# Patient Record
Sex: Female | Born: 1965
Health system: Southern US, Community
[De-identification: ages and names within clinical notes are randomized; demographics above are authoritative.]

## PROBLEM LIST (undated history)

## (undated) DIAGNOSIS — E559 Vitamin D deficiency, unspecified: Secondary | ICD-10-CM

## (undated) DIAGNOSIS — I10 Essential (primary) hypertension: Secondary | ICD-10-CM

## (undated) DIAGNOSIS — M2550 Pain in unspecified joint: Secondary | ICD-10-CM

## (undated) DIAGNOSIS — J302 Other seasonal allergic rhinitis: Secondary | ICD-10-CM

## (undated) DIAGNOSIS — K219 Gastro-esophageal reflux disease without esophagitis: Secondary | ICD-10-CM

## (undated) DIAGNOSIS — B019 Varicella without complication: Secondary | ICD-10-CM

## (undated) DIAGNOSIS — R61 Generalized hyperhidrosis: Secondary | ICD-10-CM

## (undated) DIAGNOSIS — F419 Anxiety disorder, unspecified: Secondary | ICD-10-CM

## (undated) DIAGNOSIS — R0602 Shortness of breath: Secondary | ICD-10-CM

## (undated) DIAGNOSIS — M25569 Pain in unspecified knee: Secondary | ICD-10-CM

## (undated) HISTORY — DX: Pain in unspecified joint: M25.50

## (undated) HISTORY — DX: Essential (primary) hypertension: I10

## (undated) HISTORY — DX: Vitamin D deficiency, unspecified: E55.9

## (undated) HISTORY — DX: Anxiety disorder, unspecified: F41.9

## (undated) HISTORY — DX: Other seasonal allergic rhinitis: J30.2

## (undated) HISTORY — DX: Generalized hyperhidrosis: R61

## (undated) HISTORY — PX: ABLATION: SHX5711

## (undated) HISTORY — DX: Gastro-esophageal reflux disease without esophagitis: K21.9

## (undated) HISTORY — DX: Varicella without complication: B01.9

## (undated) HISTORY — DX: Pain in unspecified knee: M25.569

## (undated) HISTORY — DX: Shortness of breath: R06.02

---

## 1989-03-01 HISTORY — PX: TONSILLECTOMY: SUR1361

## 1997-07-23 ENCOUNTER — Other Ambulatory Visit: Admission: RE | Admit: 1997-07-23 | Discharge: 1997-07-23 | Payer: Self-pay | Admitting: Gynecology

## 1998-05-01 ENCOUNTER — Other Ambulatory Visit: Admission: RE | Admit: 1998-05-01 | Discharge: 1998-05-01 | Payer: Self-pay | Admitting: Gynecology

## 1998-05-19 ENCOUNTER — Other Ambulatory Visit: Admission: RE | Admit: 1998-05-19 | Discharge: 1998-05-19 | Payer: Self-pay | Admitting: Obstetrics and Gynecology

## 1998-12-31 ENCOUNTER — Inpatient Hospital Stay (HOSPITAL_COMMUNITY): Admission: AD | Admit: 1998-12-31 | Discharge: 1999-01-03 | Payer: Self-pay | Admitting: Obstetrics & Gynecology

## 1999-01-30 ENCOUNTER — Other Ambulatory Visit: Admission: RE | Admit: 1999-01-30 | Discharge: 1999-01-30 | Payer: Self-pay | Admitting: Obstetrics and Gynecology

## 1999-06-22 ENCOUNTER — Other Ambulatory Visit: Admission: RE | Admit: 1999-06-22 | Discharge: 1999-06-22 | Payer: Self-pay | Admitting: Gynecology

## 1999-12-23 ENCOUNTER — Other Ambulatory Visit: Admission: RE | Admit: 1999-12-23 | Discharge: 1999-12-23 | Payer: Self-pay | Admitting: Gynecology

## 2000-09-07 ENCOUNTER — Other Ambulatory Visit: Admission: RE | Admit: 2000-09-07 | Discharge: 2000-09-07 | Payer: Self-pay | Admitting: Gynecology

## 2002-01-12 ENCOUNTER — Other Ambulatory Visit: Admission: RE | Admit: 2002-01-12 | Discharge: 2002-01-12 | Payer: Self-pay | Admitting: Obstetrics and Gynecology

## 2003-11-14 ENCOUNTER — Other Ambulatory Visit: Admission: RE | Admit: 2003-11-14 | Discharge: 2003-11-14 | Payer: Self-pay | Admitting: Obstetrics and Gynecology

## 2005-01-14 ENCOUNTER — Other Ambulatory Visit: Admission: RE | Admit: 2005-01-14 | Discharge: 2005-01-14 | Payer: Self-pay | Admitting: Obstetrics and Gynecology

## 2005-03-30 ENCOUNTER — Ambulatory Visit: Payer: Self-pay | Admitting: Internal Medicine

## 2005-05-21 ENCOUNTER — Ambulatory Visit (HOSPITAL_COMMUNITY): Admission: RE | Admit: 2005-05-21 | Discharge: 2005-05-21 | Payer: Self-pay | Admitting: Obstetrics and Gynecology

## 2006-06-07 ENCOUNTER — Inpatient Hospital Stay (HOSPITAL_COMMUNITY): Admission: AD | Admit: 2006-06-07 | Discharge: 2006-06-07 | Payer: Self-pay | Admitting: Obstetrics and Gynecology

## 2006-06-08 ENCOUNTER — Inpatient Hospital Stay (HOSPITAL_COMMUNITY): Admission: RE | Admit: 2006-06-08 | Discharge: 2006-06-09 | Payer: Self-pay | Admitting: Obstetrics and Gynecology

## 2010-04-21 ENCOUNTER — Other Ambulatory Visit: Payer: Self-pay | Admitting: Obstetrics and Gynecology

## 2010-04-23 ENCOUNTER — Other Ambulatory Visit: Payer: Self-pay | Admitting: Obstetrics and Gynecology

## 2010-04-23 DIAGNOSIS — R928 Other abnormal and inconclusive findings on diagnostic imaging of breast: Secondary | ICD-10-CM

## 2010-04-29 ENCOUNTER — Ambulatory Visit
Admission: RE | Admit: 2010-04-29 | Discharge: 2010-04-29 | Disposition: A | Discharge status: Home / Self Care | Payer: PRIVATE HEALTH INSURANCE | Source: Ambulatory Visit | Attending: Obstetrics and Gynecology | Admitting: Obstetrics and Gynecology

## 2010-04-29 DIAGNOSIS — R928 Other abnormal and inconclusive findings on diagnostic imaging of breast: Secondary | ICD-10-CM

## 2013-12-07 ENCOUNTER — Other Ambulatory Visit: Payer: Self-pay | Admitting: Obstetrics and Gynecology

## 2013-12-10 LAB — CYTOLOGY - PAP

## 2015-03-18 LAB — HM PAP SMEAR

## 2015-03-20 ENCOUNTER — Other Ambulatory Visit: Payer: Self-pay | Admitting: Obstetrics and Gynecology

## 2015-03-21 LAB — CYTOLOGY - PAP

## 2015-04-24 ENCOUNTER — Other Ambulatory Visit: Payer: Self-pay | Admitting: Obstetrics and Gynecology

## 2015-12-18 ENCOUNTER — Encounter: Payer: Self-pay | Admitting: Primary Care

## 2015-12-18 ENCOUNTER — Ambulatory Visit (INDEPENDENT_AMBULATORY_CARE_PROVIDER_SITE_OTHER): Payer: BLUE CROSS/BLUE SHIELD | Admitting: Primary Care

## 2015-12-18 VITALS — BP 116/74 | HR 84 | Temp 98.4°F | Ht 65.0 in | Wt 313.8 lb

## 2015-12-18 DIAGNOSIS — G8929 Other chronic pain: Secondary | ICD-10-CM

## 2015-12-18 DIAGNOSIS — M25512 Pain in left shoulder: Secondary | ICD-10-CM | POA: Diagnosis not present

## 2015-12-18 DIAGNOSIS — F411 Generalized anxiety disorder: Secondary | ICD-10-CM | POA: Diagnosis not present

## 2015-12-18 MED ORDER — FLUOXETINE HCL 20 MG PO TABS: 20.0000 mg | ORAL_TABLET | Freq: Every day | ORAL | 1 refills | Status: DC

## 2015-12-18 NOTE — Assessment & Plan Note (Signed)
Chronic, worse with laying and propping up on her left shoulder, improved without. Discussed to rest left shoulder, NSAID's PRN, ice. Exam unremarkable, will continue to monitor.

## 2015-12-18 NOTE — Assessment & Plan Note (Signed)
GAD 7 score of 17. Long discussion today regarding treatment options, she elects to start medication.  Rx for Fluoxetine 20 mg tablets sent to pharmacy. Patient is to take 1/2 tablet daily for 8 days, then advance to 1 full tablet thereafter. We discussed possible side effects of headache, GI upset, drowsiness, and SI/HI. If thoughts of SI/HI develop, we discussed to present to the emergency immediately. Patient verbalized understanding.   Follow up in 6 weeks for re-evaluation.

## 2015-12-18 NOTE — Progress Notes (Signed)
Pre visit review using our clinic review tool, if applicable. No additional management support is needed unless otherwise documented below in the visit note. 

## 2015-12-18 NOTE — Patient Instructions (Signed)
Start Fluoxetine 20 mg tablets for anxiety. Take 1/2 tablet by mouth daily for 8 days, then advance to 1 full tablet thereafter.  Please don't hesitate to e-mail or call me with any additional questions/concerns.  Please schedule a follow up appointment with me in 6 weeks for re-evaluation.   It was a pleasure to see you today! Please don't hesitate to call me with any questions. Welcome to [facility]!

## 2015-12-18 NOTE — Progress Notes (Signed)
Subjective:    Patient ID: Lindsey BignessMary J Dunlap, female    DOB: August 10, 1965, 50 y.o.   MRN: [id number]  HPI  Lindsey Dunlap is a 50 year old female who presents today to establish care and discuss the problems mentioned below. Will obtain old records.  1) Shoulder Pain: Located to the left anterior shoulder. She sleeps and props up her left shoulder when sleeping and reading. She's tried using OTC TENS units and hot baths with improvement. Once she's feeling improved she will start to lay on her left side again and the pain will return. She denies recent inury/trauma, numbness/tingling, weakness, decrease in ROM.   2) GAD: Over the past several years she's noticed feeling anxious, palpitations, daily worry, palpitations. Over the past 6 months she's noticed an increase in these symptoms as her husband works out of the state during the week. She also experiences difficulty sleeping (falling asleep) when her husband is out of town. She will be frightened with any small sound or noise within the house, and will wake up feeling paranoid as though she is supposed to be "on watch". GAD 7 score of 17 today. She has never been treated for her anxiety in the past. She does take tylenol PM every night with improvement in sleep. She denies depression, tearfulness, SI/HI.  Review of Systems  Constitutional: Negative for fatigue.  Respiratory: Negative for shortness of breath.   Cardiovascular: Negative for chest pain and palpitations.  Musculoskeletal: Positive for arthralgias.  Neurological: Negative for dizziness and headaches.  Psychiatric/Behavioral: Positive for sleep disturbance. Negative for suicidal ideas. The patient is nervous/anxious.        Past Medical History:  Diagnosis Date  . Allergy   . Chickenpox   . GERD (gastroesophageal reflux disease)      Social History   Social History  . Marital status: Married    Spouse name: N/A  . Number of children: N/A  . Years of education: N/A    Occupational History  . Not on file.   Social History Main Topics  . Smoking status: Former Games developermoker  . Smokeless tobacco: Not on file  . Alcohol use No  . Drug use: Unknown  . Sexual activity: Not on file   Other Topics Concern  . Not on file   Social History Narrative   Married.   2 children.   Works as a Futures traderhomemaker.   Enjoys reading, working on projects    Past Surgical History:  Procedure Laterality Date  . TONSILLECTOMY  1991    Family History  Problem Relation Age of Onset  . Hyperlipidemia Mother   . Hypertension Mother   . Arthritis/Rheumatoid Sister   . Hypertension Maternal Grandmother   . Hypertension Maternal Grandfather     No Known Allergies  No current outpatient prescriptions on file prior to visit.   No current facility-administered medications on file prior to visit.     BP 116/74   Pulse 84   Temp 98.4 F (36.9 C) (Oral)   Ht 5\' 5"  (1.651 m)   Wt (!) 313 lb 12.8 oz (142.3 kg)   SpO2 96%   BMI 52.22 kg/m    Objective:   Physical Exam  Constitutional: She appears well-nourished.  Neck: Neck supple.  Cardiovascular: Normal rate and regular rhythm.   Pulmonary/Chest: Effort normal and breath sounds normal.  Musculoskeletal:       Left shoulder: She exhibits normal range of motion, no tenderness, no bony tenderness, no swelling and no  pain.  Skin: Skin is warm and dry.  Psychiatric: She has a normal mood and affect.          Assessment & Plan:

## 2015-12-29 ENCOUNTER — Encounter: Payer: Self-pay | Admitting: Primary Care

## 2016-01-18 ENCOUNTER — Other Ambulatory Visit: Payer: Self-pay | Admitting: Primary Care

## 2016-01-18 DIAGNOSIS — Z0000 Encounter for general adult medical examination without abnormal findings: Secondary | ICD-10-CM

## 2016-01-26 ENCOUNTER — Other Ambulatory Visit (INDEPENDENT_AMBULATORY_CARE_PROVIDER_SITE_OTHER): Payer: BLUE CROSS/BLUE SHIELD

## 2016-01-26 DIAGNOSIS — Z0000 Encounter for general adult medical examination without abnormal findings: Secondary | ICD-10-CM

## 2016-01-26 LAB — LIPID PANEL
Cholesterol: 161 mg/dL (ref 0–200)
HDL: 43.7 mg/dL (ref >39.00)
LDL Cholesterol: 93 mg/dL (ref 0–99)
NonHDL: 117.71
Total CHOL/HDL Ratio: 4
Triglycerides: 123 mg/dL (ref 0.0–149.0)
VLDL: 24.6 mg/dL (ref 0.0–40.0)

## 2016-01-26 LAB — COMPREHENSIVE METABOLIC PANEL
ALT: 19 U/L (ref 0–35)
AST: 18 U/L (ref 0–37)
Albumin: 4 g/dL (ref 3.5–5.2)
Alkaline Phosphatase: 76 U/L (ref 39–117)
BUN: 12 mg/dL (ref 6–23)
CO2: 29 mEq/L (ref 19–32)
Calcium: 9.3 mg/dL (ref 8.4–10.5)
Chloride: 106 mEq/L (ref 96–112)
Creatinine, Ser: 0.88 mg/dL (ref 0.40–1.20)
GFR: 72.91 mL/min (ref >60.00)
Glucose, Bld: 100 mg/dL — ABNORMAL HIGH (ref 70–99)
Potassium: 4.5 mEq/L (ref 3.5–5.1)
Sodium: 139 mEq/L (ref 135–145)
Total Bilirubin: 0.6 mg/dL (ref 0.2–1.2)
Total Protein: 6.8 g/dL (ref 6.0–8.3)

## 2016-01-26 LAB — VITAMIN D 25 HYDROXY (VIT D DEFICIENCY, FRACTURES): VITD: 26.6 ng/mL — ABNORMAL LOW (ref 30.00–100.00)

## 2016-01-26 LAB — HEMOGLOBIN A1C: Hgb A1c MFr Bld: 5.3 % (ref 4.6–6.5)

## 2016-01-30 ENCOUNTER — Ambulatory Visit (INDEPENDENT_AMBULATORY_CARE_PROVIDER_SITE_OTHER): Payer: BLUE CROSS/BLUE SHIELD | Admitting: Primary Care

## 2016-01-30 ENCOUNTER — Encounter: Payer: BLUE CROSS/BLUE SHIELD | Admitting: Primary Care

## 2016-01-30 ENCOUNTER — Encounter: Payer: Self-pay | Admitting: Primary Care

## 2016-01-30 VITALS — BP 122/78 | HR 71 | Temp 98.2°F | Ht 65.0 in | Wt 313.8 lb

## 2016-01-30 DIAGNOSIS — F411 Generalized anxiety disorder: Secondary | ICD-10-CM

## 2016-01-30 DIAGNOSIS — Z23 Encounter for immunization: Secondary | ICD-10-CM

## 2016-01-30 DIAGNOSIS — Z0000 Encounter for general adult medical examination without abnormal findings: Secondary | ICD-10-CM | POA: Diagnosis not present

## 2016-01-30 DIAGNOSIS — G8929 Other chronic pain: Secondary | ICD-10-CM

## 2016-01-30 DIAGNOSIS — M25512 Pain in left shoulder: Secondary | ICD-10-CM

## 2016-01-30 MED ORDER — FLUOXETINE HCL 20 MG PO TABS: 20.0000 mg | ORAL_TABLET | Freq: Every day | ORAL | 3 refills | Status: DC

## 2016-01-30 NOTE — Patient Instructions (Signed)
You were provided with an influenza vaccination.  It's important to work on improvements in your diet by reducing consumption of fast food, fried food, processed snack foods, sugary drinks. Increase consumption of fresh vegetables and fruits, whole grains, water.  Ensure you are drinking 64 ounces of water daily.  Start exercising. You should be getting 150 minutes of moderate intensity exercise weekly.  Start taking a multi-vitamin or vitamin D supplement.   Follow up in 1 year for your annual physical or sooner if needed.  It was a pleasure to see you today!

## 2016-01-30 NOTE — Progress Notes (Signed)
Subjective:    Patient ID: Lindsey Dunlap, female    DOB: April 21, 1965, 50 y.o.   MRN: [id number]  HPI  Lindsey Dunlap is a 50 year old female who presents today for complete physical and for follow up of GAD.  Immunizations: -Tetanus: Completed 5 years ago.  -Influenza: Due today.   Diet: She endorses a fair diet. Breakfast: Fast food, fruit, peanut butter sandwich, eggs Lunch: Left overs from dinner, sandwiches Dinner: Meat, vegetables, starch, tacos Snacks: Occasionally, crackers Desserts: Daily Beverages: Water (2-3 bottles daily), sweet tea, soda  Exercise: She does not currently exercise. Eye exam: Completed in October 2017. Dental exam: Completed semi-annually. Pap Smear: Completed in 2017 Mammogram: Completed in January 2017.  1) GAD: Presented as a new patient on 12/18/15 with complaints of constantly feeling anxious, intermittent palpitations, daily worry. GAD 7 score that day of 17 so she was initiated on Fluoxetine and discussed to follow up today. Since her last visit she's feeling much better. She's able to sleep much better, she's noticed a decrease in overall anxiety, and feeling less paranoid when her husband is out of town. She denies SI/HI. GI upset, nausea, headaches.  Review of Systems  Constitutional: Negative for unexpected weight change.  HENT: Negative for rhinorrhea.   Respiratory: Negative for cough and shortness of breath.   Cardiovascular: Negative for chest pain.  Gastrointestinal: Negative for constipation and diarrhea.  Genitourinary: Negative for difficulty urinating and menstrual problem.  Musculoskeletal: Negative for arthralgias and myalgias.  Skin: Negative for rash.  Allergic/Immunologic: Negative for environmental allergies.  Neurological: Negative for dizziness, numbness and headaches.  Psychiatric/Behavioral:       Feels much better on Fluoxetine, some sleep disturbance but overall better.       Past Medical History:  Diagnosis  Date  . Allergy   . Chickenpox   . GERD (gastroesophageal reflux disease)      Social History   Social History  . Marital status: Married    Spouse name: N/A  . Number of children: N/A  . Years of education: N/A   Occupational History  . Not on file.   Social History Main Topics  . Smoking status: Former Games developermoker  . Smokeless tobacco: Not on file  . Alcohol use No  . Drug use: Unknown  . Sexual activity: Not on file   Other Topics Concern  . Not on file   Social History Narrative   Married.   2 children.   Works as a Futures traderhomemaker.   Enjoys reading, working on projects    Past Surgical History:  Procedure Laterality Date  . TONSILLECTOMY  1991    Family History  Problem Relation Age of Onset  . Hyperlipidemia Mother   . Hypertension Mother   . Arthritis/Rheumatoid Sister   . Hypertension Maternal Grandmother   . Hypertension Maternal Grandfather     No Known Allergies  No current outpatient prescriptions on file prior to visit.   No current facility-administered medications on file prior to visit.     BP 122/78   Pulse 71   Temp 98.2 F (36.8 C) (Oral)   Ht 5\' 5"  (1.651 m)   Wt (!) 313 lb 12.8 oz (142.3 kg)   SpO2 97%   BMI 52.22 kg/m    Objective:   Physical Exam  Constitutional: She is oriented to person, place, and time. She appears well-nourished.  HENT:  Right Ear: Tympanic membrane and ear canal normal.  Left Ear: Tympanic membrane and ear canal  normal.  Nose: Nose normal.  Mouth/Throat: Oropharynx is clear and moist.  Eyes: Conjunctivae and EOM are normal. Pupils are equal, round, and reactive to light.  Neck: Neck supple. No thyromegaly present.  Cardiovascular: Normal rate and regular rhythm.   No murmur heard. Pulmonary/Chest: Effort normal and breath sounds normal. She has no rales.  Abdominal: Soft. Bowel sounds are normal. There is no tenderness.  Musculoskeletal: Normal range of motion.  Lymphadenopathy:    She has no cervical  adenopathy.  Neurological: She is alert and oriented to person, place, and time. She has normal reflexes. No cranial nerve deficit.  Skin: Skin is warm and dry. No rash noted.  Psychiatric: She has a normal mood and affect.          Assessment & Plan:

## 2016-01-30 NOTE — Progress Notes (Signed)
Pre visit review using our clinic review tool, if applicable. No additional management support is needed unless otherwise documented below in the visit note. 

## 2016-01-31 DIAGNOSIS — Z0001 Encounter for general adult medical examination with abnormal findings: Secondary | ICD-10-CM

## 2016-01-31 DIAGNOSIS — Z1211 Encounter for screening for malignant neoplasm of colon: Secondary | ICD-10-CM | POA: Insufficient documentation

## 2016-01-31 NOTE — Assessment & Plan Note (Signed)
Improved since anxiety has reduced. Will continue to monitor.

## 2016-01-31 NOTE — Assessment & Plan Note (Signed)
Immunizations UTD, influenza vaccination provided today. Mammogram and Pap UTD. Poor diet, discussed to increase vegetables, fruit, whole grains. Eliminate fast food, sodas, sweet tea. Start exercising. Exam unremarkable. Labs unremarkable. Follow up in 1 year for annual exam.

## 2016-01-31 NOTE — Assessment & Plan Note (Signed)
Much improved on fluoxetine 20 mg. Denies SI/HI. Continue current regimen.

## 2016-04-14 ENCOUNTER — Encounter: Payer: Self-pay | Admitting: Primary Care

## 2016-06-25 DIAGNOSIS — Z6841 Body Mass Index (BMI) 40.0 and over, adult: Secondary | ICD-10-CM | POA: Diagnosis not present

## 2016-06-25 DIAGNOSIS — Z1231 Encounter for screening mammogram for malignant neoplasm of breast: Secondary | ICD-10-CM | POA: Diagnosis not present

## 2016-06-25 DIAGNOSIS — Z01419 Encounter for gynecological examination (general) (routine) without abnormal findings: Secondary | ICD-10-CM | POA: Diagnosis not present

## 2016-07-09 DIAGNOSIS — H25013 Cortical age-related cataract, bilateral: Secondary | ICD-10-CM | POA: Diagnosis not present

## 2016-07-09 DIAGNOSIS — H25043 Posterior subcapsular polar age-related cataract, bilateral: Secondary | ICD-10-CM | POA: Diagnosis not present

## 2016-07-09 DIAGNOSIS — H02839 Dermatochalasis of unspecified eye, unspecified eyelid: Secondary | ICD-10-CM | POA: Diagnosis not present

## 2016-07-09 DIAGNOSIS — H2513 Age-related nuclear cataract, bilateral: Secondary | ICD-10-CM | POA: Diagnosis not present

## 2017-01-12 DIAGNOSIS — H04123 Dry eye syndrome of bilateral lacrimal glands: Secondary | ICD-10-CM | POA: Diagnosis not present

## 2017-01-12 DIAGNOSIS — H40033 Anatomical narrow angle, bilateral: Secondary | ICD-10-CM | POA: Diagnosis not present

## 2017-02-17 ENCOUNTER — Other Ambulatory Visit: Payer: Self-pay | Admitting: Primary Care

## 2017-02-17 DIAGNOSIS — F411 Generalized anxiety disorder: Secondary | ICD-10-CM

## 2017-04-20 DIAGNOSIS — H04123 Dry eye syndrome of bilateral lacrimal glands: Secondary | ICD-10-CM | POA: Diagnosis not present

## 2017-05-18 ENCOUNTER — Other Ambulatory Visit: Payer: Self-pay | Admitting: Primary Care

## 2017-05-18 DIAGNOSIS — Z0000 Encounter for general adult medical examination without abnormal findings: Secondary | ICD-10-CM

## 2017-05-20 ENCOUNTER — Other Ambulatory Visit (INDEPENDENT_AMBULATORY_CARE_PROVIDER_SITE_OTHER): Payer: BLUE CROSS/BLUE SHIELD

## 2017-05-20 DIAGNOSIS — Z0000 Encounter for general adult medical examination without abnormal findings: Secondary | ICD-10-CM

## 2017-05-20 LAB — LIPID PANEL
Cholesterol: 172 mg/dL (ref 0–200)
HDL: 52.5 mg/dL (ref >39.00)
LDL Cholesterol: 95 mg/dL (ref 0–99)
NonHDL: 119.99
Total CHOL/HDL Ratio: 3
Triglycerides: 127 mg/dL (ref 0.0–149.0)
VLDL: 25.4 mg/dL (ref 0.0–40.0)

## 2017-05-20 LAB — COMPREHENSIVE METABOLIC PANEL
ALT: 31 U/L (ref 0–35)
AST: 27 U/L (ref 0–37)
Albumin: 4.2 g/dL (ref 3.5–5.2)
Alkaline Phosphatase: 98 U/L (ref 39–117)
BUN: 12 mg/dL (ref 6–23)
CO2: 29 mEq/L (ref 19–32)
Calcium: 9.3 mg/dL (ref 8.4–10.5)
Chloride: 105 mEq/L (ref 96–112)
Creatinine, Ser: 0.82 mg/dL (ref 0.40–1.20)
GFR: 78.67 mL/min (ref >60.00)
Glucose, Bld: 96 mg/dL (ref 70–99)
Potassium: 4.1 mEq/L (ref 3.5–5.1)
Sodium: 141 mEq/L (ref 135–145)
Total Bilirubin: 0.5 mg/dL (ref 0.2–1.2)
Total Protein: 7.4 g/dL (ref 6.0–8.3)

## 2017-05-23 ENCOUNTER — Ambulatory Visit (INDEPENDENT_AMBULATORY_CARE_PROVIDER_SITE_OTHER): Payer: BLUE CROSS/BLUE SHIELD | Admitting: Primary Care

## 2017-05-23 ENCOUNTER — Encounter: Payer: Self-pay | Admitting: Primary Care

## 2017-05-23 VITALS — BP 116/78 | HR 79 | Temp 98.1°F | Ht 65.0 in | Wt 327.2 lb

## 2017-05-23 DIAGNOSIS — J302 Other seasonal allergic rhinitis: Secondary | ICD-10-CM

## 2017-05-23 DIAGNOSIS — Z0001 Encounter for general adult medical examination with abnormal findings: Secondary | ICD-10-CM | POA: Diagnosis not present

## 2017-05-23 DIAGNOSIS — R220 Localized swelling, mass and lump, head: Secondary | ICD-10-CM | POA: Diagnosis not present

## 2017-05-23 DIAGNOSIS — F411 Generalized anxiety disorder: Secondary | ICD-10-CM | POA: Diagnosis not present

## 2017-05-23 MED ORDER — FLUOXETINE HCL 20 MG PO TABS: 20.0000 mg | ORAL_TABLET | Freq: Every day | ORAL | 3 refills | Status: DC

## 2017-05-23 MED ORDER — ALBUTEROL SULFATE HFA 108 (90 BASE) MCG/ACT IN AERS: 2.0000 | INHALATION_SPRAY | RESPIRATORY_TRACT | 0 refills | Status: DC | PRN

## 2017-05-23 NOTE — Assessment & Plan Note (Signed)
Occurs with season changes, uses albuterol infrequently, needs new inhaler. Rx sent to pharmacy.

## 2017-05-23 NOTE — Patient Instructions (Signed)
You will be contacted regarding your ultrasound.  Please let us know if you have not been contacted within one week.   Follow up with gynecology for your annual exam and mammogram.   Continue exercising. You should be getting 150 minutes of moderate intensity exercise weekly.  Increase vegetables, fruit, whole grains, lean protein.  Ensure you are consuming 64 ounces of water daily.  Follow up in 1 year for your annual exam or sooner if needed.  It was a pleasure to see you today!   Preventive Care 40-64 Years, Female Preventive care refers to lifestyle choices and visits with your health care provider that can promote health and wellness. What does preventive care include?  A yearly physical exam. This is also called an annual well check.  Dental exams once or twice a year.  Routine eye exams. Ask your health care provider how often you should have your eyes checked.  Personal lifestyle choices, including: ? Daily care of your teeth and gums. ? Regular physical activity. ? Eating a healthy diet. ? Avoiding tobacco and drug use. ? Limiting alcohol use. ? Practicing safe sex. ? Taking low-dose aspirin daily starting at age 96. ? Taking vitamin and mineral supplements as recommended by your health care provider. What happens during an annual well check? The services and screenings done by your health care provider during your annual well check will depend on your age, overall health, lifestyle risk factors, and family history of disease. Counseling Your health care provider may ask you questions about your:  Alcohol use.  Tobacco use.  Drug use.  Emotional well-being.  Home and relationship well-being.  Sexual activity.  Eating habits.  Work and work Statistician.  Method of birth control.  Menstrual cycle.  Pregnancy history.  Screening You may have the following tests or measurements:  Height, weight, and BMI.  Blood pressure.  Lipid and cholesterol  levels. These may be checked every 5 years, or more frequently if you are over 63 years old.  Skin check.  Lung cancer screening. You may have this screening every year starting at age 6 if you have a 30-pack-year history of smoking and currently smoke or have quit within the past 15 years.  Fecal occult blood test (FOBT) of the stool. You may have this test every year starting at age 40.  Flexible sigmoidoscopy or colonoscopy. You may have a sigmoidoscopy every 5 years or a colonoscopy every 10 years starting at age 55.  Hepatitis C blood test.  Hepatitis B blood test.  Sexually transmitted disease (STD) testing.  Diabetes screening. This is done by checking your blood sugar (glucose) after you have not eaten for a while (fasting). You may have this done every 1-3 years.  Mammogram. This may be done every 1-2 years. Talk to your health care provider about when you should start having regular mammograms. This may depend on whether you have a family history of breast cancer.  BRCA-related cancer screening. This may be done if you have a family history of breast, ovarian, tubal, or peritoneal cancers.  Pelvic exam and Pap test. This may be done every 3 years starting at age 63. Starting at age 60, this may be done every 5 years if you have a Pap test in combination with an HPV test.  Bone density scan. This is done to screen for osteoporosis. You may have this scan if you are at high risk for osteoporosis.  Discuss your test results, treatment options, and if necessary,  the need for more tests with your health care provider. Vaccines Your health care provider may recommend certain vaccines, such as:  Influenza vaccine. This is recommended every year.  Tetanus, diphtheria, and acellular pertussis (Tdap, Td) vaccine. You may need a Td booster every 10 years.  Varicella vaccine. You may need this if you have not been vaccinated.  Zoster vaccine. You may need this after age  22.  Measles, mumps, and rubella (MMR) vaccine. You may need at least one dose of MMR if you were born in 1957 or later. You may also need a second dose.  Pneumococcal 13-valent conjugate (PCV13) vaccine. You may need this if you have certain conditions and were not previously vaccinated.  Pneumococcal polysaccharide (PPSV23) vaccine. You may need one or two doses if you smoke cigarettes or if you have certain conditions.  Meningococcal vaccine. You may need this if you have certain conditions.  Hepatitis A vaccine. You may need this if you have certain conditions or if you travel or work in places where you may be exposed to hepatitis A.  Hepatitis B vaccine. You may need this if you have certain conditions or if you travel or work in places where you may be exposed to hepatitis B.  Haemophilus influenzae type b (Hib) vaccine. You may need this if you have certain conditions.  Talk to your health care provider about which screenings and vaccines you need and how often you need them. This information is not intended to replace advice given to you by your health care provider. Make sure you discuss any questions you have with your health care provider. Document Released: 03/14/2015 Document Revised: 11/05/2015 Document Reviewed: 12/17/2014 Elsevier Interactive Patient Education  [employer].

## 2017-05-23 NOTE — Progress Notes (Signed)
Subjective:    Patient ID: Lindsey Dunlap, female    DOB: 09-21-1965, 52 y.o.   MRN: [id number]  HPI  Lindsey Dunlap is a 52 year old female who presents today for complete physical.  Has noticed "lumps" to her bilateral submandibular region since November 2018. She's had two URI's with resolve within the last 6 weeks. The masses did not increase or decrease during or in between URI's. She denies tenderness.   Immunizations: -Tetanus: Completed in 2013 -Influenza: Did not completed this last season    Diet: She endorses a poor diet.  Breakfast: Biscuit Lunch: Left overs, sandwiches Dinner: Pasta, grilled meat, vegetables, take out/pizza once weekly Snacks: Popcorn, cheese crackers Desserts: Daily  Beverages: Water, soda, sweet tea (less sugar)   Exercise: She is going to the gym 3-4 days weekly for one hour at a time.  Eye exam: Completed in October 2018 Dental exam: Completes semi-annually  Pap Smear: Completed in 2017, follows with GYN Mammogram: Completed in April/May 2018, follows with GYN.   Review of Systems  Constitutional: Negative for unexpected weight change.  HENT: Negative for rhinorrhea.   Respiratory: Negative for cough and shortness of breath.   Cardiovascular: Negative for chest pain.  Gastrointestinal: Negative for constipation and diarrhea.  Genitourinary: Negative for difficulty urinating and menstrual problem.  Musculoskeletal: Negative for arthralgias and myalgias.  Skin: Negative for rash.  Allergic/Immunologic: Negative for environmental allergies.  Neurological: Negative for dizziness, numbness and headaches.  Hematological: Positive for adenopathy.       Past Medical History:  Diagnosis Date  . Chickenpox   . GERD (gastroesophageal reflux disease)   . Seasonal allergies      Social History   Socioeconomic History  . Marital status: Married    Spouse name: Not on file  . Number of children: Not on file  . Years of education: Not on  file  . Highest education level: Not on file  Occupational History  . Not on file  Social Needs  . Financial resource strain: Not on file  . Food insecurity:    Worry: Not on file    Inability: Not on file  . Transportation needs:    Medical: Not on file    Non-medical: Not on file  Tobacco Use  . Smoking status: Former Games developermoker  . Smokeless tobacco: Never Used  Substance and Sexual Activity  . Alcohol use: No  . Drug use: Not on file  . Sexual activity: Not on file  Lifestyle  . Physical activity:    Days per week: Not on file    Minutes per session: Not on file  . Stress: Not on file  Relationships  . Social connections:    Talks on phone: Not on file    Gets together: Not on file    Attends religious service: Not on file    Active member of club or organization: Not on file    Attends meetings of clubs or organizations: Not on file    Relationship status: Not on file  . Intimate partner violence:    Fear of current or ex partner: Not on file    Emotionally abused: Not on file    Physically abused: Not on file    Forced sexual activity: Not on file  Other Topics Concern  . Not on file  Social History Narrative   Married.   2 children.   Works as a Futures traderhomemaker.   Enjoys reading, working on projects  Family History  Problem Relation Age of Onset  . Hyperlipidemia Mother   . Hypertension Mother   . Arthritis/Rheumatoid Sister   . Hypertension Maternal Grandmother   . Hypertension Maternal Grandfather     No Known Allergies  No current outpatient medications on file prior to visit.   No current facility-administered medications on file prior to visit.     BP 116/78   Pulse 79   Temp 98.1 F (36.7 C) (Oral)   Ht 5\' 5"  (1.651 m)   Wt (!) 327 lb 4 oz (148.4 kg)   SpO2 97%   BMI 54.46 kg/m    Objective:   Physical Exam  Constitutional: She is oriented to person, place, and time. She appears well-nourished.  HENT:  Right Ear: Tympanic membrane and  ear canal normal.  Left Ear: Tympanic membrane and ear canal normal.  Nose: Nose normal.  Mouth/Throat: Oropharynx is clear and moist.  Eyes: Pupils are equal, round, and reactive to light. Conjunctivae and EOM are normal.  Neck: Neck supple. No thyromegaly present.    Small <0.25 cm rounded, firm, immobile, non tender masses to each lateral submandibular region.   Cardiovascular: Normal rate and regular rhythm.  No murmur heard. Pulmonary/Chest: Effort normal and breath sounds normal. She has no rales.  Abdominal: Soft. Bowel sounds are normal. There is no tenderness.  Musculoskeletal: Normal range of motion.  Lymphadenopathy:    She has cervical adenopathy.  Neurological: She is alert and oriented to person, place, and time. She has normal reflexes. No cranial nerve deficit.  Skin: Skin is warm and dry. No rash noted.  Psychiatric: She has a normal mood and affect.          Assessment & Plan:  Submandibular masses:  Located to each side of lateral submandibular region x 4 months. Exam today appears to be lymphadenopathy. Patient very worried and given duration of symptoms it is reasonable to obtain soft tissue ultrasound. Orders placed, await results.   Doreene Nest, NP

## 2017-05-23 NOTE — Assessment & Plan Note (Signed)
Doing well on Prozac, feels well managed. Refill sent to pharmacy. Denies SI/HI.

## 2017-05-23 NOTE — Assessment & Plan Note (Signed)
Immunizations UTD. Pap smear and mammogram UTD. Discussed the importance of a healthy diet and regular exercise in order for weight loss, and to reduce the risk of any potential medical problems. Exam with possible lymphadenopathy as noted, will obtain ultrasound.  Labs unremarkable. Follow up in 1 year.

## 2017-05-26 ENCOUNTER — Ambulatory Visit
Admission: RE | Admit: 2017-05-26 | Discharge: 2017-05-26 | Disposition: A | Discharge status: Home / Self Care | Payer: BLUE CROSS/BLUE SHIELD | Source: Ambulatory Visit | Attending: Primary Care | Admitting: Primary Care

## 2017-05-26 DIAGNOSIS — R220 Localized swelling, mass and lump, head: Secondary | ICD-10-CM

## 2017-05-26 DIAGNOSIS — R221 Localized swelling, mass and lump, neck: Secondary | ICD-10-CM | POA: Diagnosis not present

## 2017-07-07 ENCOUNTER — Encounter (INDEPENDENT_AMBULATORY_CARE_PROVIDER_SITE_OTHER): Payer: Self-pay | Admitting: Orthopedic Surgery

## 2017-07-07 ENCOUNTER — Ambulatory Visit (INDEPENDENT_AMBULATORY_CARE_PROVIDER_SITE_OTHER): Payer: BLUE CROSS/BLUE SHIELD

## 2017-07-07 ENCOUNTER — Ambulatory Visit (INDEPENDENT_AMBULATORY_CARE_PROVIDER_SITE_OTHER): Payer: BLUE CROSS/BLUE SHIELD | Admitting: Orthopedic Surgery

## 2017-07-07 DIAGNOSIS — M25561 Pain in right knee: Secondary | ICD-10-CM

## 2017-07-07 DIAGNOSIS — G8929 Other chronic pain: Secondary | ICD-10-CM

## 2017-07-07 DIAGNOSIS — M25562 Pain in left knee: Secondary | ICD-10-CM

## 2017-07-10 ENCOUNTER — Encounter (INDEPENDENT_AMBULATORY_CARE_PROVIDER_SITE_OTHER): Payer: Self-pay | Admitting: Orthopedic Surgery

## 2017-07-10 NOTE — Progress Notes (Signed)
Office Visit Note   Patient: Lindsey Dunlap           Date of Birth: 02-May-1965           MRN: [id number] Visit Date: 07/07/2017 Requested by: Doreene Nest, NP [address] [place], [place] [address] PCP: Doreene Nest, NP  Subjective: Chief Complaint  Patient presents with  . Right Knee - Pain  . Left Knee - Pain    HPI: Elda is a patient with bilateral knee pain right worse than left.  She fell about a year and a half ago and is been worse over the last 2 months after going to [place] on a field trip with her daughter.  She did a lot of walking at that time.  She is unsure if she has swelling but she does report weakness popping and occasionally waking up with pain.  Takes Tylenol and ibuprofen as needed.  Steps to hurt the patient primarily in the anterior aspect of the knee.  She does use a compression sleeve.              ROS: All systems reviewed are negative as they relate to the chief complaint within the history of present illness.  Patient denies  fevers or chills.   Assessment & Plan: Visit Diagnoses:  1. Chronic pain of both knees     Plan: Impression is bilateral knee pain primarily anterior.  She has a little bit more patellofemoral crepitus right versus left.  She also has some lateral patellofemoral joint where noted on the merchant view.  I do not see anything definitively structurally treatable at this time.  The patellofemoral arthritis might respond well to an injection when needed.  For now I am going to have her continue with non-squat and lunge type exercises just to get that quad muscle little stronger.  Injection would be the next step when symptoms warrant.  Follow-up with me as needed.  Follow-Up Instructions: Return if symptoms worsen or fail to improve.   Orders:  Orders Placed This Encounter  Procedures  . XR KNEE 3 VIEW RIGHT  . XR KNEE 3 VIEW LEFT   No orders of the defined types were placed in this encounter.     Procedures: No procedures performed   Clinical Data: No additional findings.  Objective: Vital Signs: There were no vitals taken for this visit.  Physical Exam:   Constitutional: Patient appears well-developed HEENT:  Head: Normocephalic Eyes:EOM are normal Neck: Normal range of motion Cardiovascular: Normal rate Pulmonary/chest: Effort normal Neurologic: Patient is alert Skin: Skin is warm Psychiatric: Patient has normal mood and affect    Ortho Exam: Ortho exam demonstrates full active and passive range of motion of both knees.  Patellofemoral crepitus is more on the right than the left.  Collateral cruciate ligaments are stable.  Extensor mechanism is intact.  Pedal pulses palpable.  Slight valgus alignment noted on the left knee but not the right.  No groin pain with internal/external rotation of either leg.  Specialty Comments:  No specialty comments available.  Imaging: No results found.   PMFS History: Patient Active Problem List   Diagnosis Date Noted  . Seasonal allergies 05/23/2017  . Encounter for well adult exam with abnormal findings 01/31/2016  . GAD (generalized anxiety disorder) 12/18/2015  . Shoulder pain, left 12/18/2015   Past Medical History:  Diagnosis Date  . Chickenpox   . GERD (gastroesophageal reflux disease)   . Seasonal allergies  Family History  Problem Relation Age of Onset  . Hyperlipidemia Mother   . Hypertension Mother   . Arthritis/Rheumatoid Sister   . Hypertension Maternal Grandmother   . Hypertension Maternal Grandfather     Past Surgical History:  Procedure Laterality Date  . TONSILLECTOMY  1991   Social History   Occupational History  . Not on file  Tobacco Use  . Smoking status: Former Games developer  . Smokeless tobacco: Never Used  Substance and Sexual Activity  . Alcohol use: No  . Drug use: Not on file  . Sexual activity: Not on file

## 2017-11-25 DIAGNOSIS — Z1231 Encounter for screening mammogram for malignant neoplasm of breast: Secondary | ICD-10-CM | POA: Diagnosis not present

## 2017-11-25 DIAGNOSIS — Z01419 Encounter for gynecological examination (general) (routine) without abnormal findings: Secondary | ICD-10-CM | POA: Diagnosis not present

## 2017-11-25 DIAGNOSIS — Z6841 Body Mass Index (BMI) 40.0 and over, adult: Secondary | ICD-10-CM | POA: Diagnosis not present

## 2017-11-28 LAB — HM MAMMOGRAPHY

## 2017-12-20 ENCOUNTER — Encounter: Payer: Self-pay | Admitting: Primary Care

## 2017-12-20 ENCOUNTER — Ambulatory Visit: Payer: BLUE CROSS/BLUE SHIELD | Admitting: Primary Care

## 2017-12-20 VITALS — BP 132/90 | HR 81 | Temp 98.5°F | Ht 65.0 in | Wt 327.0 lb

## 2017-12-20 DIAGNOSIS — F411 Generalized anxiety disorder: Secondary | ICD-10-CM | POA: Diagnosis not present

## 2017-12-20 DIAGNOSIS — Z23 Encounter for immunization: Secondary | ICD-10-CM

## 2017-12-20 MED ORDER — FLUOXETINE HCL 40 MG PO CAPS: 40.0000 mg | ORAL_CAPSULE | Freq: Every day | ORAL | 1 refills | Status: DC

## 2017-12-20 NOTE — Patient Instructions (Signed)
We've increased the dose of your fluoxetine to 40 mg. You may take two of the 20 mg capsules to make 40 mg until your bottle is empty. Only take one of the 40 mg capsules.   Please update me via My Chart in 4 weeks.  It was a pleasure to see you today!

## 2017-12-20 NOTE — Progress Notes (Signed)
Subjective:    Patient ID: Lindsey Dunlap, female    DOB: Feb 06, 1966, 52 y.o.   MRN: [id number]  HPI  Lindsey Dunlap is a 52 year old female with a history of anxiety disorder who presents today to discuss anxiety.   She is currently managed on fluoxetine 20 mg daily. Over the last 1-2 months she's noticed increased symptoms of anxiety including chest tightness, muscle tightness, palpitations, feeling nervous, daily worry, obsessing about things, some sleep disturbance.   She's been under a lot of stress since her oldest child has started college and she's now home schooling her other child. Her husband travels out of town often and she is at home with her children. She is also undergoing peri-menopause. GAD 7 score of 17 today. She denies symptoms of depression.   Review of Systems  Respiratory: Positive for chest tightness.   Cardiovascular: Positive for palpitations. Negative for chest pain.  Neurological: Negative for headaches.  Psychiatric/Behavioral: Positive for sleep disturbance. The patient is nervous/anxious.        See HPI       Past Medical History:  Diagnosis Date  . Chickenpox   . GERD (gastroesophageal reflux disease)   . Seasonal allergies      Social History   Socioeconomic History  . Marital status: Married    Spouse name: Not on file  . Number of children: Not on file  . Years of education: Not on file  . Highest education level: Not on file  Occupational History  . Not on file  Social Needs  . Financial resource strain: Not on file  . Food insecurity:    Worry: Not on file    Inability: Not on file  . Transportation needs:    Medical: Not on file    Non-medical: Not on file  Tobacco Use  . Smoking status: Former Games developer  . Smokeless tobacco: Never Used  Substance and Sexual Activity  . Alcohol use: No  . Drug use: Not on file  . Sexual activity: Not on file  Lifestyle  . Physical activity:    Days per week: Not on file    Minutes per  session: Not on file  . Stress: Not on file  Relationships  . Social connections:    Talks on phone: Not on file    Gets together: Not on file    Attends religious service: Not on file    Active member of club or organization: Not on file    Attends meetings of clubs or organizations: Not on file    Relationship status: Not on file  . Intimate partner violence:    Fear of current or ex partner: Not on file    Emotionally abused: Not on file    Physically abused: Not on file    Forced sexual activity: Not on file  Other Topics Concern  . Not on file  Social History Narrative   Married.   2 children.   Works as a Futures trader.   Enjoys reading, working on projects    Past Surgical History:  Procedure Laterality Date  . TONSILLECTOMY  1991    Family History  Problem Relation Age of Onset  . Hyperlipidemia Mother   . Hypertension Mother   . Arthritis/Rheumatoid Sister   . Hypertension Maternal Grandmother   . Hypertension Maternal Grandfather     No Known Allergies  Current Outpatient Medications on File Prior to Visit  Medication Sig Dispense Refill  . albuterol (PROAIR HFA)  108 (90 Base) MCG/ACT inhaler Inhale 2 puffs into the lungs every 4 (four) hours as needed for wheezing or shortness of breath. 1 Inhaler 0  . Multiple Vitamins-Minerals (PRESERVISION AREDS 2+MULTI VIT PO)     . ranitidine (ZANTAC 150 MAXIMUM STRENGTH) 150 MG tablet      No current facility-administered medications on file prior to visit.     BP 132/90 (BP Location: Right Arm, Patient Position: Sitting, Cuff Size: Large)   Pulse 81   Temp 98.5 F (36.9 C) (Oral)   Ht 5\' 5"  (1.651 m)   Wt (!) 327 lb (148.3 kg)   SpO2 98%   BMI 54.42 kg/m    Objective:   Physical Exam  Constitutional: She appears well-nourished.  Neck: Neck supple.  Cardiovascular: Normal rate and regular rhythm.  Respiratory: Effort normal and breath sounds normal.  Skin: Skin is warm and dry.  Psychiatric: She has a  normal mood and affect.           Assessment & Plan:

## 2017-12-20 NOTE — Assessment & Plan Note (Signed)
GAD 7 score of 17 today. Symptoms could be due to increased stress vs peri-menopause. Increase fluoxetine to 40 mg daily. She will update in 4 weeks. Denies SI/HI.

## 2018-03-09 ENCOUNTER — Telehealth: Payer: BLUE CROSS/BLUE SHIELD | Admitting: Family

## 2018-03-09 DIAGNOSIS — J329 Chronic sinusitis, unspecified: Secondary | ICD-10-CM | POA: Diagnosis not present

## 2018-03-09 DIAGNOSIS — B9689 Other specified bacterial agents as the cause of diseases classified elsewhere: Secondary | ICD-10-CM | POA: Diagnosis not present

## 2018-03-09 MED ORDER — AMOXICILLIN-POT CLAVULANATE 875-125 MG PO TABS: 1.0000 | ORAL_TABLET | Freq: Two times a day (BID) | ORAL | 0 refills | Status: AC

## 2018-03-09 NOTE — Progress Notes (Signed)
Thank you for the details you included in the comment boxes. Those details are very helpful in determining the best course of treatment for you and help us to provide the best care. Keep using the inhaler. For the yeast infection, it is a random thing that varies. Let us know if this happens.  We are sorry that you are not feeling well.  Here is how we plan to help!  Based on what you have shared with me it looks like you have sinusitis.  Sinusitis is inflammation and infection in the sinus cavities of the head.  Based on your presentation I believe you most likely have Acute Bacterial Sinusitis.  This is an infection caused by bacteria and is treated with antibiotics. I have prescribed Augmentin 875mg /125mg  one tablet twice daily with food, for 7 days. You may use an oral decongestant such as Mucinex D or if you have glaucoma or high blood pressure use plain Mucinex. Saline nasal spray help and can safely be used as often as needed for congestion.  If you develop worsening sinus pain, fever or notice severe headache and vision changes, or if symptoms are not better after completion of antibiotic, please schedule an appointment with a health care provider.    Sinus infections are not as easily transmitted as other respiratory infection, however we still recommend that you avoid close contact with loved ones, especially the very young and elderly.  Remember to wash your hands thoroughly throughout the day as this is the number one way to prevent the spread of infection!  Home Care:  Only take medications as instructed by your medical team.  Complete the entire course of an antibiotic.  Do not take these medications with alcohol.  A steam or ultrasonic humidifier can help congestion.  You can place a towel over your head and breathe in the steam from hot water coming from a faucet.  Avoid close contacts especially the very young and the elderly.  Cover your mouth when you cough or sneeze.  Always  remember to wash your hands.  Get Help Right Away If:  You develop worsening fever or sinus pain.  You develop a severe head ache or visual changes.  Your symptoms persist after you have completed your treatment plan.  Make sure you  Understand these instructions.  Will watch your condition.  Will get help right away if you are not doing well or get worse.  Your e-visit answers were reviewed by a board certified advanced clinical practitioner to complete your personal care plan.  Depending on the condition, your plan could have included both over the counter or prescription medications.  If there is a problem please reply  once you have received a response from your provider.  Your safety is important to us.  If you have drug allergies check your prescription carefully.    You can use MyChart to ask questions about today's visit, request a non-urgent call back, or ask for a work or school excuse for 24 hours related to this e-Visit. If it has been greater than 24 hours you will need to follow up with your provider, or enter a new e-Visit to address those concerns.  You will get an e-mail in the next two days asking about your experience.  I hope that your e-visit has been valuable and will speed your recovery. Thank you for using e-visits.

## 2018-05-01 ENCOUNTER — Encounter: Payer: Self-pay | Admitting: Primary Care

## 2018-05-01 ENCOUNTER — Ambulatory Visit: Payer: BLUE CROSS/BLUE SHIELD | Admitting: Primary Care

## 2018-05-01 VITALS — BP 152/98 | HR 77 | Temp 98.0°F | Ht 65.0 in | Wt 337.0 lb

## 2018-05-01 DIAGNOSIS — I10 Essential (primary) hypertension: Secondary | ICD-10-CM | POA: Insufficient documentation

## 2018-05-01 DIAGNOSIS — M25541 Pain in joints of right hand: Secondary | ICD-10-CM

## 2018-05-01 DIAGNOSIS — M25542 Pain in joints of left hand: Secondary | ICD-10-CM | POA: Diagnosis not present

## 2018-05-01 DIAGNOSIS — Z8261 Family history of arthritis: Secondary | ICD-10-CM | POA: Diagnosis not present

## 2018-05-01 LAB — COMPREHENSIVE METABOLIC PANEL
ALT: 21 U/L (ref 0–35)
AST: 16 U/L (ref 0–37)
Albumin: 4.2 g/dL (ref 3.5–5.2)
Alkaline Phosphatase: 91 U/L (ref 39–117)
BUN: 16 mg/dL (ref 6–23)
CO2: 28 mEq/L (ref 19–32)
Calcium: 9.2 mg/dL (ref 8.4–10.5)
Chloride: 103 mEq/L (ref 96–112)
Creatinine, Ser: 0.84 mg/dL (ref 0.40–1.20)
GFR: 71.71 mL/min (ref >60.00)
Glucose, Bld: 110 mg/dL — ABNORMAL HIGH (ref 70–99)
Potassium: 4.1 mEq/L (ref 3.5–5.1)
Sodium: 138 mEq/L (ref 135–145)
Total Bilirubin: 0.6 mg/dL (ref 0.2–1.2)
Total Protein: 7.1 g/dL (ref 6.0–8.3)

## 2018-05-01 LAB — SEDIMENTATION RATE: Sed Rate: 34 mm/hr — ABNORMAL HIGH (ref 0–30)

## 2018-05-01 LAB — URIC ACID: Uric Acid, Serum: 6.2 mg/dL (ref 2.4–7.0)

## 2018-05-01 LAB — TSH: TSH: 0.49 u[IU]/mL (ref 0.35–4.50)

## 2018-05-01 LAB — HEMOGLOBIN A1C: Hgb A1c MFr Bld: 5.6 % (ref 4.6–6.5)

## 2018-05-01 LAB — C-REACTIVE PROTEIN: CRP: 2.5 mg/dL (ref 0.5–20.0)

## 2018-05-01 MED ORDER — HYDROCHLOROTHIAZIDE 25 MG PO TABS: 25.0000 mg | ORAL_TABLET | Freq: Every day | ORAL | 0 refills | Status: DC

## 2018-05-01 NOTE — Assessment & Plan Note (Signed)
Elevated blood pressure readings on two separate visits, also with one home reading recently. Suspect symptoms primarily are secondary to elevated BP readings but will check other labs including TSH, A1C, CMP to rule out other cause.  Rx for HCTZ 25 mg sent to pharmacy.  We will plan to see her back in 2-3 weeks for BP check and repeat BMP.

## 2018-05-01 NOTE — Patient Instructions (Signed)
Start hydrochlorothiazide 25 mg tablets for blood pressure. Take 1 tablet daily.  Stop by the lab prior to leaving today. I will notify you of your results once received.   Schedule a follow up visit for 2-3 weeks for blood pressure check.  It was a pleasure to see you today!

## 2018-05-01 NOTE — Assessment & Plan Note (Signed)
Chronic, intermittent. Without swelling to joints. Family history of RA in sister. Check labs today for rheumatoid arthritis evaluation, also check uric acid.

## 2018-05-01 NOTE — Progress Notes (Signed)
Subjective:    Patient ID: Lindsey Dunlap, female    DOB: [birth year], 53 y.o.   MRN: [id number]  HPI  Ms. Vanmeter is a 53 year old female with a history of seasonal allergies, sinusitis, anxiety disorder, obesity who presents today with a chief complaint of headache.  Her headaches are located to the bilateral frontal lobes and maxillary sinuses which have been present for the last two months. She was treated for sinusitis in early/mid December 2019, felt relief until she completed the antibiotics.  She also endorses elevated blood pressure readings. She checked her BP at home last week which was 178/100, had a bad headache with this. Family history of hypertension in mother, father, sister.   Also endorses fatigue, constipation, brain fog, feeling sluggish, dry skin, joint aches to hands/fingers. These symptoms began around 6 months ago. Family history of rheumatoid arthritis in her sister.   BP Readings from Last 3 Encounters:  05/01/18 (!) 152/98  12/20/17 132/90  05/23/17 116/78     Review of Systems  Constitutional: Positive for fatigue.  Respiratory: Negative for shortness of breath.   Cardiovascular: Negative for chest pain.  Gastrointestinal: Positive for constipation.  Musculoskeletal: Positive for arthralgias.  Neurological: Positive for headaches. Negative for dizziness.       Past Medical History:  Diagnosis Date  . Chickenpox   . GERD (gastroesophageal reflux disease)   . Seasonal allergies      Social History   Socioeconomic History  . Marital status: Married    Spouse name: Not on file  . Number of children: Not on file  . Years of education: Not on file  . Highest education level: Not on file  Occupational History  . Not on file  Social Needs  . Financial resource strain: Not on file  . Food insecurity:    Worry: Not on file    Inability: Not on file  . Transportation needs:    Medical: Not on file    Non-medical: Not on file  Tobacco Use    . Smoking status: Former Games developer  . Smokeless tobacco: Never Used  Substance and Sexual Activity  . Alcohol use: No  . Drug use: Not on file  . Sexual activity: Not on file  Lifestyle  . Physical activity:    Days per week: Not on file    Minutes per session: Not on file  . Stress: Not on file  Relationships  . Social connections:    Talks on phone: Not on file    Gets together: Not on file    Attends religious service: Not on file    Active member of club or organization: Not on file    Attends meetings of clubs or organizations: Not on file    Relationship status: Not on file  . Intimate partner violence:    Fear of current or ex partner: Not on file    Emotionally abused: Not on file    Physically abused: Not on file    Forced sexual activity: Not on file  Other Topics Concern  . Not on file  Social History Narrative   Married.   2 children.   Works as a Futures trader.   Enjoys reading, working on projects    Past Surgical History:  Procedure Laterality Date  . TONSILLECTOMY  1991    Family History  Problem Relation Age of Onset  . Hyperlipidemia Mother   . Hypertension Mother   . Arthritis/Rheumatoid Sister   . Hypertension  Maternal Grandmother   . Hypertension Maternal Grandfather     No Known Allergies  Current Outpatient Medications on File Prior to Visit  Medication Sig Dispense Refill  . albuterol (PROAIR HFA) 108 (90 Base) MCG/ACT inhaler Inhale 2 puffs into the lungs every 4 (four) hours as needed for wheezing or shortness of breath. 1 Inhaler 0  . FLUoxetine (PROZAC) 40 MG capsule Take 1 capsule (40 mg total) by mouth daily. 90 capsule 1  . Multiple Vitamins-Minerals (PRESERVISION AREDS 2+MULTI VIT PO)      No current facility-administered medications on file prior to visit.     BP (!) 152/98   Pulse 77   Temp 98 F (36.7 C) (Oral)   Ht 5\' 5"  (1.651 m)   Wt (!) 337 lb (152.9 kg)   SpO2 97%   BMI 56.08 kg/m    Objective:   Physical Exam   Constitutional: She appears well-nourished.  Neck: Neck supple.  Cardiovascular: Normal rate and regular rhythm.  Respiratory: Effort normal and breath sounds normal.  Musculoskeletal:     Comments: No obvious swelling to joints of either hands  Skin: Skin is warm and dry.  Psychiatric: She has a normal mood and affect.           Assessment & Plan:

## 2018-05-04 LAB — CYCLIC CITRUL PEPTIDE ANTIBODY, IGG: Cyclic Citrullin Peptide Ab: <16 UNITS

## 2018-05-04 LAB — RHEUMATOID FACTOR: Rhuematoid fact SerPl-aCnc: <14 IU/mL (ref <14)

## 2018-05-19 ENCOUNTER — Telehealth: Payer: Self-pay | Admitting: Primary Care

## 2018-05-19 NOTE — Telephone Encounter (Signed)
Called to cancel 3/23 appt. Pt needs to be scheduled for a lab only appt at her convenience. Okay to push out a few weeks.

## 2018-05-22 ENCOUNTER — Ambulatory Visit: Payer: BLUE CROSS/BLUE SHIELD | Admitting: Primary Care

## 2018-06-02 DIAGNOSIS — I10 Essential (primary) hypertension: Secondary | ICD-10-CM

## 2018-06-15 ENCOUNTER — Other Ambulatory Visit (INDEPENDENT_AMBULATORY_CARE_PROVIDER_SITE_OTHER): Payer: BLUE CROSS/BLUE SHIELD

## 2018-06-15 DIAGNOSIS — I10 Essential (primary) hypertension: Secondary | ICD-10-CM

## 2018-06-15 LAB — BASIC METABOLIC PANEL
BUN: 15 mg/dL (ref 6–23)
CO2: 29 mEq/L (ref 19–32)
Calcium: 9.3 mg/dL (ref 8.4–10.5)
Chloride: 99 mEq/L (ref 96–112)
Creatinine, Ser: 0.86 mg/dL (ref 0.40–1.20)
GFR: 69.75 mL/min (ref >60.00)
Glucose, Bld: 125 mg/dL — ABNORMAL HIGH (ref 70–99)
Potassium: 3.7 mEq/L (ref 3.5–5.1)
Sodium: 137 mEq/L (ref 135–145)

## 2018-06-23 ENCOUNTER — Other Ambulatory Visit: Payer: Self-pay | Admitting: Primary Care

## 2018-06-23 DIAGNOSIS — I10 Essential (primary) hypertension: Secondary | ICD-10-CM

## 2018-06-23 DIAGNOSIS — F411 Generalized anxiety disorder: Secondary | ICD-10-CM

## 2018-07-19 DIAGNOSIS — M79641 Pain in right hand: Secondary | ICD-10-CM | POA: Diagnosis not present

## 2018-07-19 DIAGNOSIS — M79642 Pain in left hand: Secondary | ICD-10-CM | POA: Diagnosis not present

## 2018-09-08 ENCOUNTER — Encounter: Payer: Self-pay | Admitting: Primary Care

## 2018-09-08 ENCOUNTER — Ambulatory Visit (INDEPENDENT_AMBULATORY_CARE_PROVIDER_SITE_OTHER): Payer: BC Managed Care – PPO | Admitting: Primary Care

## 2018-09-08 VITALS — BP 121/79

## 2018-09-08 DIAGNOSIS — Z1211 Encounter for screening for malignant neoplasm of colon: Secondary | ICD-10-CM

## 2018-09-08 DIAGNOSIS — I10 Essential (primary) hypertension: Secondary | ICD-10-CM | POA: Diagnosis not present

## 2018-09-08 DIAGNOSIS — F411 Generalized anxiety disorder: Secondary | ICD-10-CM

## 2018-09-08 DIAGNOSIS — Z0000 Encounter for general adult medical examination without abnormal findings: Secondary | ICD-10-CM | POA: Diagnosis not present

## 2018-09-08 MED ORDER — FLUOXETINE HCL 40 MG PO CAPS: 40.0000 mg | ORAL_CAPSULE | Freq: Every day | ORAL | 3 refills | Status: DC

## 2018-09-08 MED ORDER — HYDROCHLOROTHIAZIDE 25 MG PO TABS: 25.0000 mg | ORAL_TABLET | Freq: Every day | ORAL | 3 refills | Status: DC

## 2018-09-08 NOTE — Assessment & Plan Note (Signed)
Immunizations up-to-date. Mammogram and Pap smear up-to-date, follows with GYN. Due for colonoscopy, referral placed to GI. Encouraged a healthy diet and regular exercise. Virtual exam unremarkable. Labs pending.

## 2018-09-08 NOTE — Patient Instructions (Signed)
Start exercising. You should be getting 150 minutes of moderate intensity exercise weekly.  It's important to improve your diet by reducing consumption of fast food, fried food, processed snack foods, sugary drinks. Increase consumption of fresh vegetables and fruits, whole grains, water.  Ensure you are drinking 64 ounces of water daily.  Call the main line to schedule a lab appointment for cholesterol check.  You will be contacted regarding your referral to GI for the colonoscopy.  Please let us know if you have not been contacted within one week.   It was a pleasure to see you today! Allie Bossier, NP-C

## 2018-09-08 NOTE — Assessment & Plan Note (Signed)
Stable with home readings. Continue hydrochlorothiazide 25 mg. BMP from April 2020 unremarkable.

## 2018-09-08 NOTE — Assessment & Plan Note (Signed)
Doing well on fluoxetine 40 mg. Denies SI/HI. Refill sent to pharmacy.

## 2018-09-08 NOTE — Progress Notes (Signed)
Subjective:    Patient ID: Lindsey Dunlap, female    DOB: 1966/02/01, 53 y.o.   MRN: [id number]  HPI  Virtual Visit via Video Note  I connected with Lindsey Dunlap on 09/08/18 at  3:20 PM EDT by a video enabled telemedicine application and verified that I am speaking with the correct person using two identifiers.  Location: Patient: Home Provider: Office   I discussed the limitations of evaluation and management by telemedicine and the availability of in person appointments. The patient expressed understanding and agreed to proceed.  History of Present Illness:  Lindsey Dunlap is a 53 year old female who presents today for complete physical.  Immunizations: -Tetanus: Completed in 2013 -Influenza: Due this season   Diet: She endorses eating mostly home cooked meals, some take out food. Tries to eat vegetables and lean protein. She drinks mostly water, sweet tea, soda. Eats desserts daily. Exercise: She is active at home, no regular exercise.   Eye exam: Completed in November 2019 Dental exam: Completes regularly.  Colonoscopy: Never completed Pap Smear: UTD, follows with GYN Mammogram: Completed in February 2020  BP Readings from Last 3 Encounters:  09/08/18 121/79  05/01/18 (!) 152/98  12/20/17 132/90      Observations/Objective:  Alert and oriented. Appears well, not sickly. No distress. Speaking in complete sentences.   Assessment and Plan:  See problem based charting.  Follow Up Instructions:  Start exercising. You should be getting 150 minutes of moderate intensity exercise weekly.  It's important to improve your diet by reducing consumption of fast food, fried food, processed snack foods, sugary drinks. Increase consumption of fresh vegetables and fruits, whole grains, water.  Ensure you are drinking 64 ounces of water daily.  Call the main line to schedule a lab appointment for cholesterol check.  You will be contacted regarding your referral to  GI for the colonoscopy.  Please let us know if you have not been contacted within one week.   It was a pleasure to see you today! Mayra ReelKate Opie Maclaughlin, NP-C    I discussed the assessment and treatment plan with the patient. The patient was provided an opportunity to ask questions and all were answered. The patient agreed with the plan and demonstrated an understanding of the instructions.   The patient was advised to call back or seek an in-person evaluation if the symptoms worsen or if the condition fails to improve as anticipated.     Doreene NestKatherine K Mandel Seiden, NP    Review of Systems  Constitutional: Negative for unexpected weight change.  HENT: Negative for rhinorrhea.   Respiratory: Negative for cough and shortness of breath.   Cardiovascular: Negative for chest pain.  Gastrointestinal: Negative for constipation and diarrhea.  Genitourinary: Negative for difficulty urinating.  Musculoskeletal: Negative for arthralgias and myalgias.  Skin: Negative for rash.  Allergic/Immunologic: Negative for environmental allergies.  Neurological: Negative for dizziness, numbness and headaches.  Psychiatric/Behavioral: The patient is not nervous/anxious.        Past Medical History:  Diagnosis Date   Chickenpox    GERD (gastroesophageal reflux disease)    Seasonal allergies      Social History   Socioeconomic History   Marital status: Married    Spouse name: Not on file   Number of children: Not on file   Years of education: Not on file   Highest education level: Not on file  Occupational History   Not on file  Social Needs   Financial resource strain: Not  on file   Food insecurity    Worry: Not on file    Inability: Not on file   Transportation needs    Medical: Not on file    Non-medical: Not on file  Tobacco Use   Smoking status: Former Smoker   Smokeless tobacco: Never Used  Substance and Sexual Activity   Alcohol use: No   Drug use: Not on file   Sexual  activity: Not on file  Lifestyle   Physical activity    Days per week: Not on file    Minutes per session: Not on file   Stress: Not on file  Relationships   Social connections    Talks on phone: Not on file    Gets together: Not on file    Attends religious service: Not on file    Active member of club or organization: Not on file    Attends meetings of clubs or organizations: Not on file    Relationship status: Not on file   Intimate partner violence    Fear of current or ex partner: Not on file    Emotionally abused: Not on file    Physically abused: Not on file    Forced sexual activity: Not on file  Other Topics Concern   Not on file  Social History Narrative   Married.   2 children.   Works as a Agricultural engineer.   Enjoys reading, working on projects    Past Surgical History:  Procedure Laterality Date   TONSILLECTOMY  1991    Family History  Problem Relation Age of Onset   Hyperlipidemia Mother    Hypertension Mother    Arthritis/Rheumatoid Sister    Hypertension Maternal Grandmother    Hypertension Maternal Grandfather     No Known Allergies  Current Outpatient Medications on File Prior to Visit  Medication Sig Dispense Refill   albuterol (PROAIR HFA) 108 (90 Base) MCG/ACT inhaler Inhale 2 puffs into the lungs every 4 (four) hours as needed for wheezing or shortness of breath. 1 Inhaler 0   Multiple Vitamins-Minerals (PRESERVISION AREDS 2+MULTI VIT PO)      No current facility-administered medications on file prior to visit.     BP 121/79    Objective:   Physical Exam  Constitutional: She is oriented to person, place, and time. She appears well-nourished.  Neck: Neck supple.  Respiratory: Effort normal.  Musculoskeletal: Normal range of motion.  Neurological: She is alert and oriented to person, place, and time.  Psychiatric: She has a normal mood and affect.           Assessment & Plan:

## 2018-09-15 ENCOUNTER — Encounter: Payer: Self-pay | Admitting: Internal Medicine

## 2018-10-05 ENCOUNTER — Telehealth: Payer: Self-pay

## 2018-10-05 NOTE — Telephone Encounter (Signed)
Dr. Henrene Pastor,   Lindsey Dunlap is a direct colonoscopy screening that is scheduled for [facility] on 10/26/18. The patients BMI is 56.08. Do you want to see her in the office prior to her being scheduled for a [facility] colonoscopy? Please advise. Thanks!  Riki Sheer, LPN ( [facility] )

## 2018-10-10 NOTE — Telephone Encounter (Signed)
Routine office visit with me would be best. Thanks  Dr. Henrene Pastor

## 2018-10-16 NOTE — Telephone Encounter (Signed)
The patient is scheduled with you for an OV on  Wed. 11/01/18 at 10:20. Thanks!  Riki Sheer, LPN

## 2018-10-26 ENCOUNTER — Encounter: Payer: BC Managed Care – PPO | Admitting: Internal Medicine

## 2018-11-01 ENCOUNTER — Ambulatory Visit (INDEPENDENT_AMBULATORY_CARE_PROVIDER_SITE_OTHER): Payer: BC Managed Care – PPO | Admitting: Internal Medicine

## 2018-11-01 ENCOUNTER — Other Ambulatory Visit: Payer: Self-pay

## 2018-11-01 ENCOUNTER — Encounter: Payer: Self-pay | Admitting: Internal Medicine

## 2018-11-01 VITALS — BP 144/92 | HR 92 | Temp 97.6°F | Ht 65.0 in | Wt 336.1 lb

## 2018-11-01 DIAGNOSIS — Z1211 Encounter for screening for malignant neoplasm of colon: Secondary | ICD-10-CM | POA: Diagnosis not present

## 2018-11-01 DIAGNOSIS — K649 Unspecified hemorrhoids: Secondary | ICD-10-CM

## 2018-11-01 DIAGNOSIS — K219 Gastro-esophageal reflux disease without esophagitis: Secondary | ICD-10-CM | POA: Diagnosis not present

## 2018-11-01 MED ORDER — NA SULFATE-K SULFATE-MG SULF 17.5-3.13-1.6 GM/177ML PO SOLN: 1.0000 | Freq: Once | ORAL | 0 refills | Status: DC

## 2018-11-01 NOTE — Progress Notes (Signed)
HISTORY OF PRESENT ILLNESS:  Lindsey Dunlap is a 53 y.o. female, mother of 2 and daughter of my patient [name], who was referred by her primary provider Allie Bossier, NP regarding colon cancer screening.  Other issues are symptomatic hemorrhoids, constipation, and GERD.  Patient was reminded that having from 52 she should be due for screening colonoscopy.  Because of her weight and associated risks of anesthesia, an office evaluation was arranged.  First, patient denies any family history of colon cancer.  Parents have a history of colon polyps.  She does report intermittent rectal bleeding she attributes this to hemorrhoids.  She has had thrombosed external hemorrhoids on several occasions.  She is wondering about therapy for her hemorrhoids such as banding.  Next, she does report chronic constipation.  Moves her bowels 4-5 times per week.  Describes it as hard.  He has not been on laxative agents.  Apparently her daughter has issues with constipation and takes MiraLAX.  Next, the patient has chronic GERD.  The patient reports problems with heartburn at night.  For this she takes Pepcid each evening (previously on Zantac prior to it coming off of the market).  This controls her heartburn symptoms.  Otherwise, she will wake up with heartburn.  She denies dysphasia.  She does not use alcohol.  She quit smoking 10 years ago.  Review of outside laboratories from April 2025 glucose 125.  Otherwise normal basic metabolic panel.  Review of x-ray file shows no relevant abnormalities.  REVIEW OF SYSTEMS:  All non-GI ROS negative unless otherwise stated in HPI except for night sweats, anxiety  Past Medical History:  Diagnosis Date  . Anxiety   . Chickenpox   . GERD (gastroesophageal reflux disease)   . High blood pressure   . Night sweats   . Seasonal allergies     Past Surgical History:  Procedure Laterality Date  . [place]  reports that she has  quit smoking. She has never used smokeless tobacco. She reports that she does not drink alcohol or use drugs.  family history includes Arthritis/Rheumatoid in her sister; Hyperlipidemia in her mother; Hypertension in her maternal grandfather, maternal grandmother, and mother; Liver cancer in her maternal uncle.  No Known Allergies     PHYSICAL EXAMINATION: Vital signs: BP (!) 144/92 (BP Location: Right Arm, Patient Position: Sitting, Cuff Size: Normal) Comment (Cuff Size): forearm  Pulse 92   Temp 97.6 F (36.4 C) (Other (Comment)) Comment (Src): thermascan  Ht 5\' 5"  (1.651 m)   Wt (!) 336 lb 2 oz (152.5 kg)   BMI 55.93 kg/m   Constitutional: Pleasant, generally well-appearing, no acute distress Psychiatric: alert and oriented x3, cooperative Eyes: extraocular movements intact, anicteric, conjunctiva pink Mouth: oral pharynx moist, no lesions Neck: supple no lymphadenopathy Cardiovascular: heart regular rate and rhythm, no murmur Lungs: clear to auscultation bilaterally Abdomen: soft, obese, nontender, nondistended, no obvious ascites, no peritoneal signs, normal bowel sounds, no organomegaly Rectal: Deferred until colonoscopy Extremities: no clubbing or cyanosis.  Trace lower extremity edema bilaterally Skin: no lesions on visible extremities Neuro: No focal deficits.  Cranial nerves intact  ASSESSMENT:  1.  Chronic constipation. 2.  Intermittent rectal bleeding attributed to hemorrhoids 3.  History of thrombosed external hemorrhoids 4.  Chronic GERD without alarm features controlled with at night H2 receptor antagonist therapy 5.  Colon cancer screening.  Baseline risk. 6.  Morbid obesity with BMI 55.  High risk  for sedation.   PLAN:  1.  Recommend MiraLAX daily for chronic constipation.  Advised how to titrate to need 2.  Will evaluate hemorrhoids (external versus internal and severity) and provide appropriate treatment options 3.  Reflux precautions with attention to  weight loss 4.  Continue H2 receptor antagonist therapy as this is effective 5.  We discussed in detail multiple options for colon cancer screening including FIT, Cologuard, optical colonoscopy (requiring monitored anesthesia care at the hospital).  After an extensive and balanced discussion regarding the pros and cons of each strategy as well as the implications of positive results she has decided to proceed with optical colonoscopy.  As stated, she is HIGH RISK.The nature of the procedure, as well as the risks, benefits, and alternatives were carefully and thoroughly reviewed with the patient. Ample time for discussion and questions allowed. The patient understood, was satisfied, and agreed to proceed.  She had some concerns being able to tolerate the prep.  We discussed strategies such as sucking on hard candy.  She was offered antiemetic but declined. A copy of this consultation note has been sent to Ms. Chestine Sporelark

## 2018-11-01 NOTE — Patient Instructions (Signed)
You have been scheduled for a colonoscopy. Please follow written instructions given to you at your visit today.  Please pick up your prep supplies at the pharmacy within the next 1-3 days. If you use inhalers (even only as needed), please bring them with you on the day of your procedure.   

## 2018-11-22 ENCOUNTER — Ambulatory Visit (INDEPENDENT_AMBULATORY_CARE_PROVIDER_SITE_OTHER): Payer: BC Managed Care – PPO | Admitting: Primary Care

## 2018-11-22 DIAGNOSIS — F411 Generalized anxiety disorder: Secondary | ICD-10-CM

## 2018-11-22 MED ORDER — FLUOXETINE HCL 20 MG PO CAPS: 20.0000 mg | ORAL_CAPSULE | Freq: Every day | ORAL | 0 refills | Status: DC

## 2018-11-22 NOTE — Assessment & Plan Note (Signed)
  Deteriorated on fluoxetine 40 mg. Denies concerns for depression. Has felt well managed on fluoxetine overall up until recently. Increase dose to 60 mg for now with hopes of decreasing back to 40 mg at some point. Encouraged exercise and other stress reducing hobbies. She will update in one month.

## 2018-11-22 NOTE — Progress Notes (Signed)
Subjective:    Patient ID: Lindsey Dunlap, female    DOB: 1965/07/21, 53 y.o.   MRN: [id number]  HPI  Virtual Visit via Video Note  I connected with Cresenciano Lick Defreitas on 11/22/18 at  3:00 PM EDT by a video enabled telemedicine application and verified that I am speaking with the correct person using two identifiers.  Location: Patient: Home Provider: Office   I discussed the limitations of evaluation and management by telemedicine and the availability of in person appointments. The patient expressed understanding and agreed to proceed.  History of Present Illness:  Lindsey Dunlap is a 53 year old female with a history of GAD who presents today with a chief complaint of anxiety.  She is currently managed on fluoxetine 40 mg daily for which she has taken for the last one year. Overall has felt well managed on fluoxetine up until a few months ago.   Recent symptoms include palpitations, twitching of her eye, daily worry with difficulty letting go. Her husband travels and is often out of town. She will be getting a part time job soon so she's hoping this will help to occupy her mind. She denies feeling depressed and down. GAD 7 score of 17 today.    Observations/Objective:  Alert and oriented. Appears well, not sickly. No distress. Speaking in complete sentences.   Assessment and Plan:  Deteriorated on fluoxetine 40 mg. Denies concerns for depression. Has felt well managed on fluoxetine overall up until recently. Increase dose to 60 mg for now with hopes of decreasing back to 40 mg at some point. Encouraged exercise and other stress reducing hobbies. She will update in one month.  Follow Up Instructions:  We've increased your dose of fluoxetine to 60 mg. Take both the 20 mg and 40 mg capsule.   Please send me an update in one month as discussed.  It was a pleasure to see you today! Allie Bossier, NP-C    I discussed the assessment and treatment plan with the patient. The  patient was provided an opportunity to ask questions and all were answered. The patient agreed with the plan and demonstrated an understanding of the instructions.   The patient was advised to call back or seek an in-person evaluation if the symptoms worsen or if the condition fails to improve as anticipated.    Pleas Koch, NP    Review of Systems  Respiratory: Negative for shortness of breath.   Cardiovascular: Negative for chest pain.  Psychiatric/Behavioral:       See HPI       Past Medical History:  Diagnosis Date  . Anxiety   . Chickenpox   . GERD (gastroesophageal reflux disease)   . High blood pressure   . Night sweats   . Seasonal allergies      Social History   Socioeconomic History  . Marital status: Married    Spouse name: Jenny Reichmann  . Number of children: 2  . Years of education: Not on file  . Highest education level: Not on file  Occupational History  . Not on file  Social Needs  . Financial resource strain: Not on file  . Food insecurity    Worry: Not on file    Inability: Not on file  . Transportation needs    Medical: Not on file    Non-medical: Not on file  Tobacco Use  . Smoking status: Former Research scientist (life sciences)  . Smokeless tobacco: Never Used  Substance and Sexual Activity  .  Alcohol use: No  . Drug use: Never  . Sexual activity: Yes  Lifestyle  . Physical activity    Days per week: Not on file    Minutes per session: Not on file  . Stress: Not on file  Relationships  . Social Musician on phone: Not on file    Gets together: Not on file    Attends religious service: Not on file    Active member of club or organization: Not on file    Attends meetings of clubs or organizations: Not on file    Relationship status: Not on file  . Intimate partner violence    Fear of current or ex partner: Not on file    Emotionally abused: Not on file    Physically abused: Not on file    Forced sexual activity: Not on file  Other Topics Concern   . Not on file  Social History Narrative   Married.   2 children.   Works as a Futures trader.   Enjoys reading, working on projects    Past Surgical History:  Procedure Laterality Date  . TONSILLECTOMY  1991    Family History  Problem Relation Age of Onset  . Hyperlipidemia Mother   . Hypertension Mother   . Arthritis/Rheumatoid Sister   . Hypertension Maternal Grandmother   . Hypertension Maternal Grandfather   . Liver cancer Maternal Uncle   . Uterine cancer Neg Hx   . Colon polyps Neg Hx   . Esophageal cancer Neg Hx   . Pancreatic cancer Neg Hx   . Stomach cancer Neg Hx   . Rectal cancer Neg Hx     No Known Allergies  Current Outpatient Medications on File Prior to Visit  Medication Sig Dispense Refill  . albuterol (PROAIR HFA) 108 (90 Base) MCG/ACT inhaler Inhale 2 puffs into the lungs every 4 (four) hours as needed for wheezing or shortness of breath. 1 Inhaler 0  . FLUoxetine (PROZAC) 40 MG capsule Take 1 capsule (40 mg total) by mouth daily. For anxiety. 90 capsule 3  . hydrochlorothiazide (HYDRODIURIL) 25 MG tablet Take 1 tablet (25 mg total) by mouth daily. For blood pressure 90 tablet 3  . Multiple Vitamins-Minerals (PRESERVISION AREDS 2+MULTI VIT PO)      No current facility-administered medications on file prior to visit.     There were no vitals taken for this visit.   Objective:   Physical Exam  Constitutional: She is oriented to person, place, and time. She appears well-nourished.  Respiratory: Effort normal.  Neurological: She is alert and oriented to person, place, and time.  Psychiatric: She has a normal mood and affect.           Assessment & Plan:

## 2018-11-22 NOTE — Patient Instructions (Signed)
We've increased your dose of fluoxetine to 60 mg. Take both the 20 mg and 40 mg capsule.   Please send me an update in one month as discussed.  It was a pleasure to see you today! Allie Bossier, NP-C

## 2018-11-29 ENCOUNTER — Other Ambulatory Visit: Payer: Self-pay | Admitting: Internal Medicine

## 2018-12-08 ENCOUNTER — Other Ambulatory Visit (HOSPITAL_COMMUNITY)
Admission: RE | Admit: 2018-12-08 | Discharge: 2018-12-08 | Disposition: A | Discharge status: Home / Self Care | Payer: BC Managed Care – PPO | Source: Ambulatory Visit | Attending: Internal Medicine | Admitting: Internal Medicine

## 2018-12-08 DIAGNOSIS — Z01812 Encounter for preprocedural laboratory examination: Secondary | ICD-10-CM | POA: Insufficient documentation

## 2018-12-08 DIAGNOSIS — Z20828 Contact with and (suspected) exposure to other viral communicable diseases: Secondary | ICD-10-CM | POA: Diagnosis not present

## 2018-12-11 ENCOUNTER — Other Ambulatory Visit: Payer: Self-pay

## 2018-12-11 ENCOUNTER — Encounter (HOSPITAL_COMMUNITY): Payer: Self-pay | Admitting: *Deleted

## 2018-12-11 LAB — NOVEL CORONAVIRUS, NAA (HOSP ORDER, SEND-OUT TO REF LAB; TAT 18-24 HRS): SARS-CoV-2, NAA: NOT DETECTED

## 2018-12-11 NOTE — Progress Notes (Signed)
Pre call done. Pt states they have been able to quarantine and has not had any fevers or flu like symptoms. Pt will arrive at [place] other questions answered.

## 2018-12-12 ENCOUNTER — Ambulatory Visit (HOSPITAL_COMMUNITY): Payer: BC Managed Care – PPO | Admitting: Certified Registered"

## 2018-12-12 ENCOUNTER — Encounter (HOSPITAL_COMMUNITY)
Admission: RE | Disposition: A | Discharge status: Home / Self Care | Payer: Self-pay | Source: Home / Self Care | Attending: Internal Medicine

## 2018-12-12 ENCOUNTER — Encounter (HOSPITAL_COMMUNITY): Payer: Self-pay

## 2018-12-12 ENCOUNTER — Other Ambulatory Visit (HOSPITAL_COMMUNITY): Payer: BC Managed Care – PPO

## 2018-12-12 ENCOUNTER — Ambulatory Visit (HOSPITAL_COMMUNITY)
Admission: RE | Admit: 2018-12-12 | Discharge: 2018-12-12 | Disposition: A | Discharge status: Home / Self Care | Payer: BC Managed Care – PPO | Attending: Internal Medicine | Admitting: Internal Medicine

## 2018-12-12 DIAGNOSIS — K648 Other hemorrhoids: Secondary | ICD-10-CM | POA: Insufficient documentation

## 2018-12-12 DIAGNOSIS — I10 Essential (primary) hypertension: Secondary | ICD-10-CM | POA: Diagnosis not present

## 2018-12-12 DIAGNOSIS — Z6841 Body Mass Index (BMI) 40.0 and over, adult: Secondary | ICD-10-CM | POA: Insufficient documentation

## 2018-12-12 DIAGNOSIS — K649 Unspecified hemorrhoids: Secondary | ICD-10-CM

## 2018-12-12 DIAGNOSIS — Z1211 Encounter for screening for malignant neoplasm of colon: Secondary | ICD-10-CM

## 2018-12-12 DIAGNOSIS — Z8719 Personal history of other diseases of the digestive system: Secondary | ICD-10-CM | POA: Diagnosis not present

## 2018-12-12 DIAGNOSIS — K5909 Other constipation: Secondary | ICD-10-CM | POA: Diagnosis not present

## 2018-12-12 DIAGNOSIS — K625 Hemorrhage of anus and rectum: Secondary | ICD-10-CM | POA: Insufficient documentation

## 2018-12-12 DIAGNOSIS — K219 Gastro-esophageal reflux disease without esophagitis: Secondary | ICD-10-CM

## 2018-12-12 DIAGNOSIS — F411 Generalized anxiety disorder: Secondary | ICD-10-CM | POA: Diagnosis not present

## 2018-12-12 HISTORY — PX: COLONOSCOPY WITH PROPOFOL: SHX5780

## 2018-12-12 SURGERY — COLONOSCOPY WITH PROPOFOL
Anesthesia: Monitor Anesthesia Care

## 2018-12-12 MED ORDER — PROPOFOL 500 MG/50ML IV EMUL
INTRAVENOUS | Status: DC | PRN
  Administered 2018-12-12: 125 ug/kg/min via INTRAVENOUS

## 2018-12-12 MED ORDER — PROPOFOL 500 MG/50ML IV EMUL
INTRAVENOUS | Status: AC
  Filled 2018-12-12: qty 50

## 2018-12-12 MED ORDER — PROPOFOL 10 MG/ML IV BOLUS
INTRAVENOUS | Status: AC
  Filled 2018-12-12: qty 20

## 2018-12-12 MED ORDER — LACTATED RINGERS IV SOLN
INTRAVENOUS | Status: DC
  Administered 2018-12-12: 09:00:00 via INTRAVENOUS

## 2018-12-12 MED ORDER — PROPOFOL 10 MG/ML IV BOLUS
INTRAVENOUS | Status: DC | PRN
  Administered 2018-12-12 (×6): 20 mg via INTRAVENOUS
  Administered 2018-12-12: 10 mg via INTRAVENOUS

## 2018-12-12 MED ORDER — SODIUM CHLORIDE 0.9 % IV SOLN: INTRAVENOUS | Status: DC

## 2018-12-12 SURGICAL SUPPLY — 22 items

## 2018-12-12 NOTE — Anesthesia Procedure Notes (Signed)
Procedure Name: MAC Date/Time: 12/12/2018 9:27 AM Performed by: Niel Hummer, CRNA Pre-anesthesia Checklist: Patient identified, Emergency Drugs available, Suction available and Patient being monitored Patient Re-evaluated:Patient Re-evaluated prior to induction Oxygen Delivery Method: Simple face mask

## 2018-12-12 NOTE — Op Note (Signed)
[facility] Patient Name: Lindsey Dunlap Procedure Date: 12/12/2018 MRN: [id number] Attending MD: Wilhemina Bonito. Marina Goodell , MD Date of Birth: [birth year] CSN: [id number] Age: 53 Admit Type: Outpatient Procedure:                Colonoscopy Indications:              Screening for colorectal malignant neoplasm.                            Incidental minor rectal bleeding; hemorrhoids Providers:                Wilhemina Bonito. Marina Goodell, MD, Dwain Sarna, RN, Arlee Muslim Tech., Technician, Yates Decamp, CRNA Referring MD:             Doreene Nest, NP Medicines:                Monitored Anesthesia Care Complications:            No immediate complications. Estimated blood loss:                            None. Estimated Blood Loss:     Estimated blood loss: none. Procedure:                Pre-Anesthesia Assessment:                           - Prior to the procedure, a History and Physical                            was performed, and patient medications and                            allergies were reviewed. The patient's tolerance of                            previous anesthesia was also reviewed. The risks                            and benefits of the procedure and the sedation                            options and risks were discussed with the patient.                            All questions were answered, and informed consent                            was obtained. Prior Anticoagulants: The patient has                            taken no previous anticoagulant or antiplatelet  agents. ASA Grade Assessment: II - A patient with                            mild systemic disease. After reviewing the risks                            and benefits, the patient was deemed in                            satisfactory condition to undergo the procedure.                           After obtaining informed consent, the colonoscope                  was passed under direct vision. Throughout the                            procedure, the patient's blood pressure, pulse, and                            oxygen saturations were monitored continuously. The                            CF-HQ190L ([id number]) Olympus colonoscope was                            introduced through the anus and advanced to the the                            cecum, identified by appendiceal orifice and                            ileocecal valve. The ileocecal valve, appendiceal                            orifice, and rectum were photographed. The quality                            of the bowel preparation was excellent. The                            colonoscopy was performed without difficulty. The                            patient tolerated the procedure well. The bowel                            preparation used was SUPREP via split dose                            instruction. Scope In: 9:37:05 AM Scope Out: 9:50:51 AM Scope Withdrawal Time: 0 hours 9 minutes 26 seconds  Total Procedure Duration: 0 hours 13 minutes 46 seconds  Findings:      The entire  examined colon appeared normal on direct and retroflexion       views.      Non-bleeding internal hemorrhoids were found during retroflexion. The       hemorrhoids were moderate. Impression:               - The entire examined colon is normal on direct and                            retroflexion views.                           - Non-bleeding internal hemorrhoids.                           - No specimens collected. Moderate Sedation:      none Recommendation:           - Repeat colonoscopy in 10 years for screening                            purposes.                           - Patient has a contact number available for                            emergencies. The signs and symptoms of potential                            delayed complications were discussed with the                             patient. Return to normal activities tomorrow.                            Written discharge instructions were provided to the                            patient.                           - Resume previous diet.                           - Continue present medications.                           -Recommend Metamucil 2 tablespoons daily in 14                            ounces of water or juice. This will improve your                            bowel habits had issues with hemorrhoids Procedure Code(s):        --- Professional ---  45378, Colonoscopy, flexible; diagnostic, including                            collection of specimen(s) by brushing or washing,                            when performed (separate procedure) Diagnosis Code(s):        --- Professional ---                           Z12.11, Encounter for screening for malignant                            neoplasm of colon                           K64.8, Other hemorrhoids CPT copyright 2019 American Medical Association. All rights reserved. The codes documented in this report are preliminary and upon coder review may  be revised to meet current compliance requirements. Docia Chuck. Henrene Pastor, MD 12/12/2018 10:01:46 AM This report has been signed electronically. Number of Addenda: 0

## 2018-12-12 NOTE — Anesthesia Preprocedure Evaluation (Signed)
Anesthesia Evaluation  Patient identified by MRN, date of birth, ID band Patient awake    Reviewed: Allergy & Precautions, H&P , NPO status , Patient's Chart, lab work & pertinent test results  Airway Mallampati: II   Neck ROM: full    Dental   Pulmonary former smoker,    breath sounds clear to auscultation       Cardiovascular hypertension,  Rhythm:regular Rate:Normal     Neuro/Psych PSYCHIATRIC DISORDERS Anxiety    GI/Hepatic GERD  ,  Endo/Other    Renal/GU      Musculoskeletal   Abdominal   Peds  Hematology   Anesthesia Other Findings   Reproductive/Obstetrics                             Anesthesia Physical Anesthesia Plan  ASA: II  Anesthesia Plan: MAC   Post-op Pain Management:    Induction: Intravenous  PONV Risk Score and Plan: 2 and Propofol infusion and Treatment may vary due to age or medical condition  Airway Management Planned: Simple Face Mask  Additional Equipment:   Intra-op Plan:   Post-operative Plan:   Informed Consent: I have reviewed the patients History and Physical, chart, labs and discussed the procedure including the risks, benefits and alternatives for the proposed anesthesia with the patient or authorized representative who has indicated his/her understanding and acceptance.       Plan Discussed with: CRNA, Anesthesiologist and Surgeon  Anesthesia Plan Comments:         Anesthesia Quick Evaluation

## 2018-12-12 NOTE — Discharge Instructions (Signed)
YOU HAD AN ENDOSCOPIC PROCEDURE TODAY: Refer to the procedure report and other information in the discharge instructions given to you for any specific questions about what was found during the examination. If this information does not answer your questions, please call [place] office at [phone] to clarify.  ° °YOU SHOULD EXPECT: Some feelings of bloating in the abdomen. Passage of more gas than usual. Walking can help get rid of the air that was put into your GI tract during the procedure and reduce the bloating. If you had a lower endoscopy (such as a colonoscopy or flexible sigmoidoscopy) you may notice spotting of blood in your stool or on the toilet paper. Some abdominal soreness may be present for a day or two, also. ° °DIET: Your first meal following the procedure should be a light meal and then it is ok to progress to your normal diet. A half-sandwich or bowl of soup is an example of a good first meal. Heavy or fried foods are harder to digest and may make you feel nauseous or bloated. Drink plenty of fluids but you should avoid alcoholic beverages for 24 hours. If you had a esophageal dilation, please see attached instructions for diet.   ° °ACTIVITY: Your care partner should take you home directly after the procedure. You should plan to take it easy, moving slowly for the rest of the day. You can resume normal activity the day after the procedure however YOU SHOULD NOT DRIVE, use power tools, machinery or perform tasks that involve climbing or major physical exertion for 24 hours (because of the sedation medicines used during the test).  ° °SYMPTOMS TO REPORT IMMEDIATELY: °A gastroenterologist can be reached at any hour. Please call [phone]  for any of the following symptoms:  °Following lower endoscopy (colonoscopy, flexible sigmoidoscopy) °Excessive amounts of blood in the stool  °Significant tenderness, worsening of abdominal pains  °Swelling of the abdomen that is new, acute  °Fever of 100° or  higher  °Following upper endoscopy (EGD, EUS, ERCP, esophageal dilation) °Vomiting of blood or coffee ground material  °New, significant abdominal pain  °New, significant chest pain or pain under the shoulder blades  °Painful or persistently difficult swallowing  °New shortness of breath  °Black, tarry-looking or red, bloody stools ° °FOLLOW UP:  °If any biopsies were taken you will be contacted by phone or by letter within the next 1-3 weeks. Call [phone]  if you have not heard about the biopsies in 3 weeks.  °Please also call with any specific questions about appointments or follow up tests. ° °

## 2018-12-12 NOTE — H&P (Signed)
Lindsey Dunlap is a 54 year old female, mother of 2 and daughter of my patient Lindsey Dunlap, who was referred by her primary provider Lindsey Bossier, NP regarding colon cancer screening.  Other issues are symptomatic hemorrhoids, constipation, and GERD.  Patient was reminded that having from 52 she should be due for screening colonoscopy.  Because of her weight and associated risks of anesthesia, an office evaluation was arranged.  First, patient denies any family history of colon cancer.  Parents have a history of colon polyps.  She does report intermittent rectal bleeding she attributes this to hemorrhoids.  She has had thrombosed external hemorrhoids on several occasions.  She is wondering about therapy for her hemorrhoids such as banding.  Next, she does report chronic constipation.  Moves her bowels 4-5 times per week.  Describes it as hard.  He has not been on laxative agents.  Apparently her daughter has issues with constipation and takes MiraLAX.  Next, the patient has chronic GERD.  The patient reports problems with heartburn at night.  For this she takes Pepcid each evening (previously on Zantac prior to it coming off of the market).  This controls her heartburn symptoms.  Otherwise, she will wake up with heartburn.  She denies dysphasia.  She does not use alcohol.  She quit smoking 10 years ago.  Review of outside laboratories from April 2025 glucose 125.  Otherwise normal basic metabolic panel.  Review of x-ray file shows no relevant abnormalities.  REVIEW OF SYSTEMS:  All non-GI ROS negative unless otherwise stated in HPI except for night sweats, anxiety      Past Medical History:  Diagnosis Date  . Anxiety   . Chickenpox   . GERD (gastroesophageal reflux disease)   . High blood pressure   . Night sweats   . Seasonal allergies          Past Surgical History:  Procedure Laterality Date  . [place]  reports that she has quit  smoking. She has never used smokeless tobacco. She reports that she does not drink alcohol or use drugs.  family history includes Arthritis/Rheumatoid in her sister; Hyperlipidemia in her mother; Hypertension in her maternal grandfather, maternal grandmother, and mother; Liver cancer in her maternal uncle.  No Known Allergies     PHYSICAL EXAMINATION: Vital signs: BP (!) 144/92 (BP Location: Right Arm, Patient Position: Sitting, Cuff Size: Normal) Comment (Cuff Size): forearm  Pulse 92   Temp 97.6 F (36.4 C) (Other (Comment)) Comment (Src): thermascan  Ht 5\' 5"  (1.651 m)   Wt (!) 336 lb 2 oz (152.5 kg)   BMI 55.93 kg/m   Constitutional: Pleasant, generally well-appearing, no acute distress Psychiatric: alert and oriented x3, cooperative Eyes: extraocular movements intact, anicteric, conjunctiva pink Mouth: oral pharynx moist, no lesions Neck: supple no lymphadenopathy Cardiovascular: heart regular rate and rhythm, no murmur Lungs: clear to auscultation bilaterally Abdomen: soft, obese, nontender, nondistended, no obvious ascites, no peritoneal signs, normal bowel sounds, no organomegaly Rectal: Deferred until colonoscopy Extremities: no clubbing or cyanosis.  Trace lower extremity edema bilaterally Skin: no lesions on visible extremities Neuro: No focal deficits.  Cranial nerves intact  ASSESSMENT:  1.  Chronic constipation. 2.  Intermittent rectal bleeding attributed to hemorrhoids 3.  History of thrombosed external hemorrhoids 4.  Chronic GERD without alarm features controlled with at night H2 receptor antagonist therapy 5.  Colon cancer screening.  Baseline risk. 6.  Morbid obesity with BMI 55.  High risk for sedation.   PLAN:  1.  Recommend MiraLAX daily for chronic constipation.  Advised how to titrate to need 2.  Will evaluate hemorrhoids (external versus internal and severity) and provide appropriate treatment options 3.  Reflux precautions with attention  to weight loss 4.  Continue H2 receptor antagonist therapy as this is effective 5.  We discussed in detail multiple options for colon cancer screening including FIT, Cologuard, optical colonoscopy (requiring monitored anesthesia care at the hospital).  After an extensive and balanced discussion regarding the pros and cons of each strategy as well as the implications of positive results she has decided to proceed with optical colonoscopy.  As stated, she is HIGH RISK.The nature of the procedure, as well as the risks, benefits, and alternatives were carefully and thoroughly reviewed with the patient. Ample time for discussion and questions allowed. The patient understood, was satisfied, and agreed to proceed.  She had some concerns being able to tolerate the prep.  We discussed strategies such as sucking on hard candy.  She was offered antiemetic but declined.  Patient reports no interval problems since her office visit.  She tolerated the prep well.  Not ready for screening colonoscopy.  Wilhemina Bonito. Eda Keys., M.D. [facility] Division of Gastroenterology

## 2018-12-12 NOTE — Transfer of Care (Signed)
Immediate Anesthesia Transfer of Care Note  Patient: Cresenciano Lick Krogh  Procedure(s) Performed: COLONOSCOPY WITH PROPOFOL (N/A )  Patient Location: PACU  Anesthesia Type:MAC  Level of Consciousness: awake, alert  and oriented  Airway & Oxygen Therapy: Patient Spontanous Breathing and Patient connected to face mask oxygen  Post-op Assessment: Report given to RN, Post -op Vital signs reviewed and stable and Patient moving all extremities X 4  Post vital signs: Reviewed and stable  Last Vitals:  Vitals Value Taken Time  BP    Temp    Pulse 73 12/12/18 0958  Resp    SpO2 98 % 12/12/18 0958  Vitals shown include unvalidated device data.  Last Pain:  Vitals:   12/12/18 0820  TempSrc: Oral  PainSc: 0-No pain         Complications: No apparent anesthesia complications

## 2018-12-12 NOTE — Anesthesia Postprocedure Evaluation (Signed)
Anesthesia Post Note  Patient: Lindsey Dunlap  Procedure(s) Performed: COLONOSCOPY WITH PROPOFOL (N/A )     Patient location during evaluation: Endoscopy Anesthesia Type: MAC Level of consciousness: awake and alert Pain management: pain level controlled Vital Signs Assessment: post-procedure vital signs reviewed and stable Respiratory status: spontaneous breathing, nonlabored ventilation, respiratory function stable and patient connected to nasal cannula oxygen Cardiovascular status: blood pressure returned to baseline and stable Postop Assessment: no apparent nausea or vomiting Anesthetic complications: no    Last Vitals:  Vitals:   12/12/18 1015 12/12/18 1020  BP: 120/64 120/64  Pulse:    Resp:  16  Temp:    SpO2:  100%    Last Pain:  Vitals:   12/12/18 1015  TempSrc:   PainSc: 0-No pain                 Ziyana Morikawa S

## 2018-12-14 ENCOUNTER — Encounter (HOSPITAL_COMMUNITY): Payer: Self-pay | Admitting: Internal Medicine

## 2018-12-15 DIAGNOSIS — Z6841 Body Mass Index (BMI) 40.0 and over, adult: Secondary | ICD-10-CM | POA: Diagnosis not present

## 2018-12-15 DIAGNOSIS — N939 Abnormal uterine and vaginal bleeding, unspecified: Secondary | ICD-10-CM | POA: Diagnosis not present

## 2019-02-04 ENCOUNTER — Other Ambulatory Visit: Payer: Self-pay | Admitting: Primary Care

## 2019-02-04 DIAGNOSIS — F411 Generalized anxiety disorder: Secondary | ICD-10-CM

## 2019-02-26 IMAGING — US US SOFT TISSUE HEAD/NECK
1 series · 11 of 11 positions shown · non-contrast
Comparison: None available

CLINICAL DATA: Superficial bilateral submandibular palpable nodules

EXAM:
ULTRASOUND OF HEAD/NECK SOFT TISSUES
TECHNIQUE: Ultrasound examination of the head and neck soft tissues was
performed in the area of clinical concern.

[Series 1: us soft tissue head/neck · 0.07mm/px · 11 of 11 slices shown]
[im 1/11]
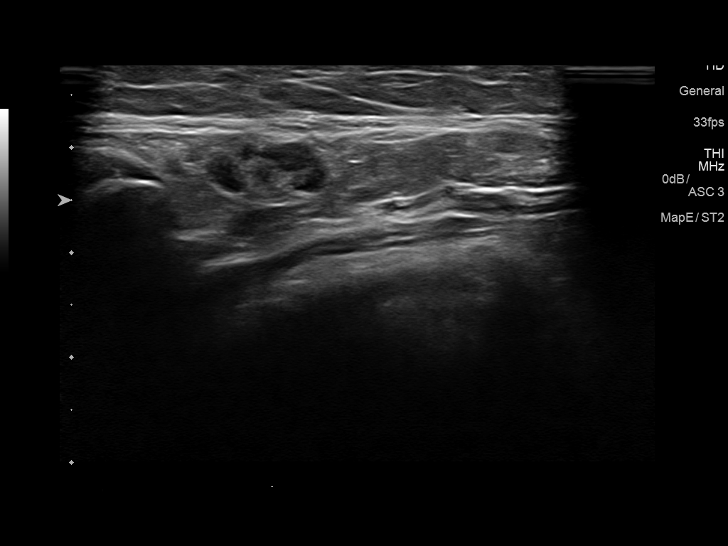
[im 2/11]
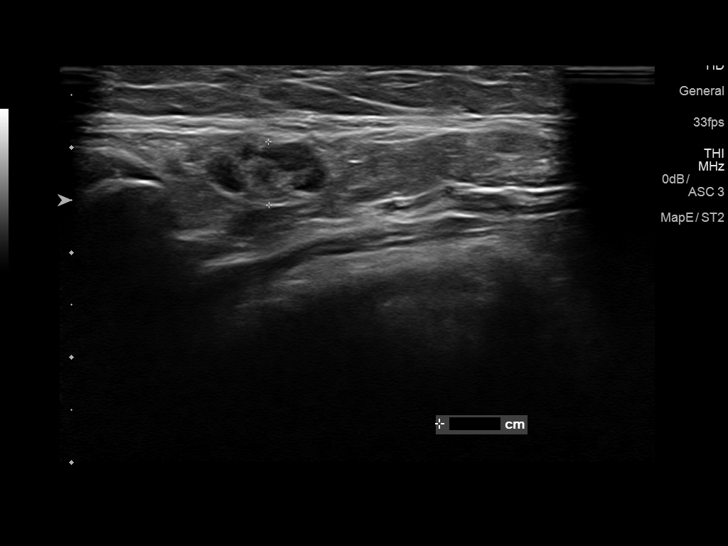
[im 3/11]
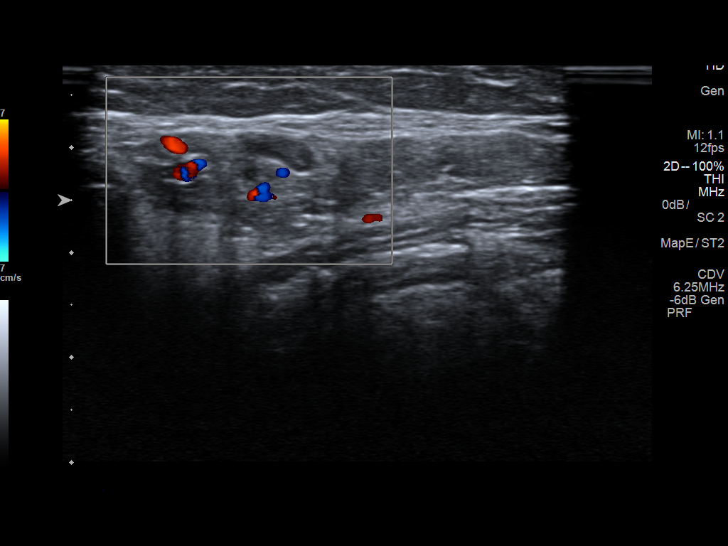
[im 4/11]
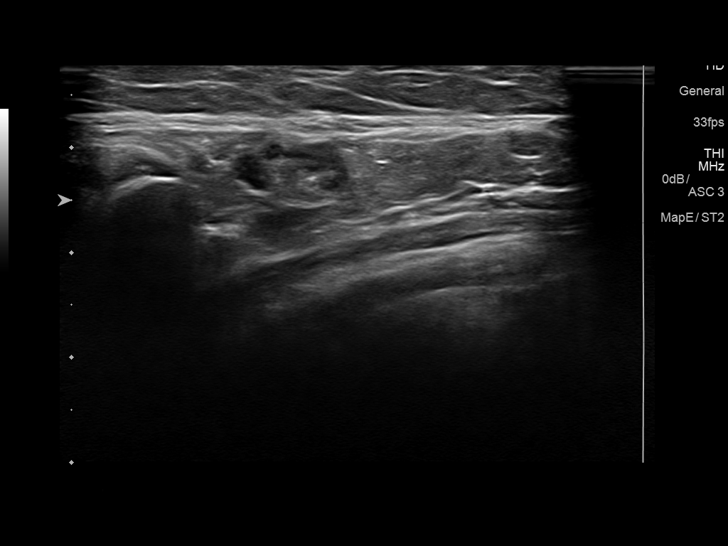
[im 5/11]
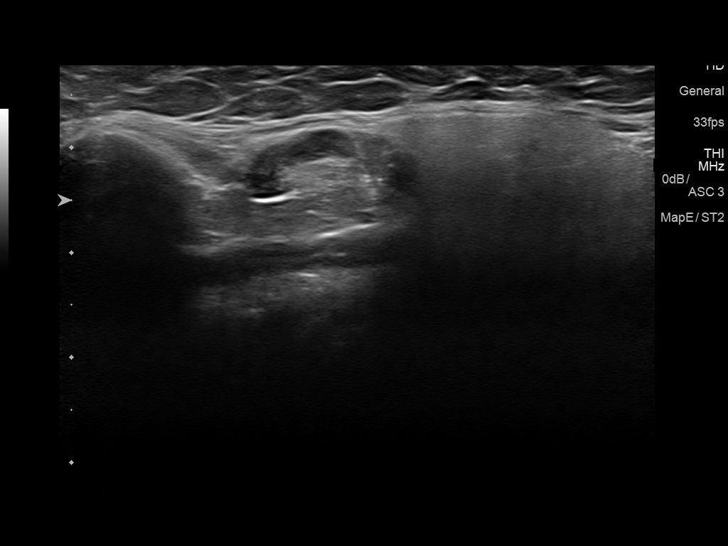
[im 6/11]
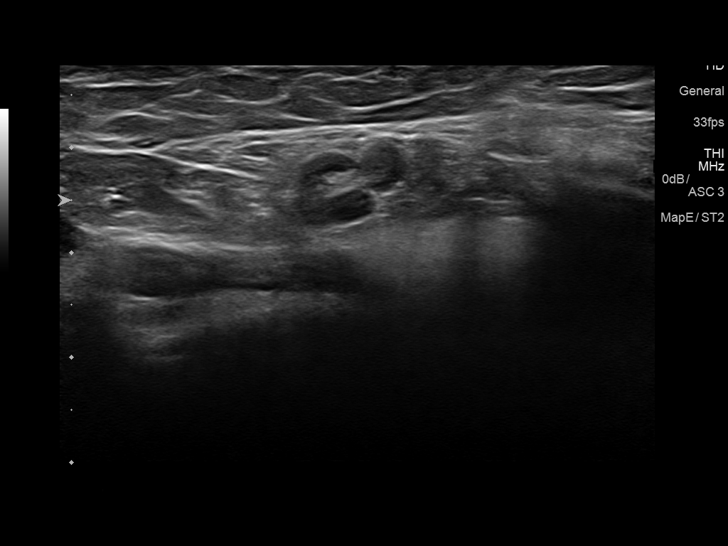
[im 7/11]
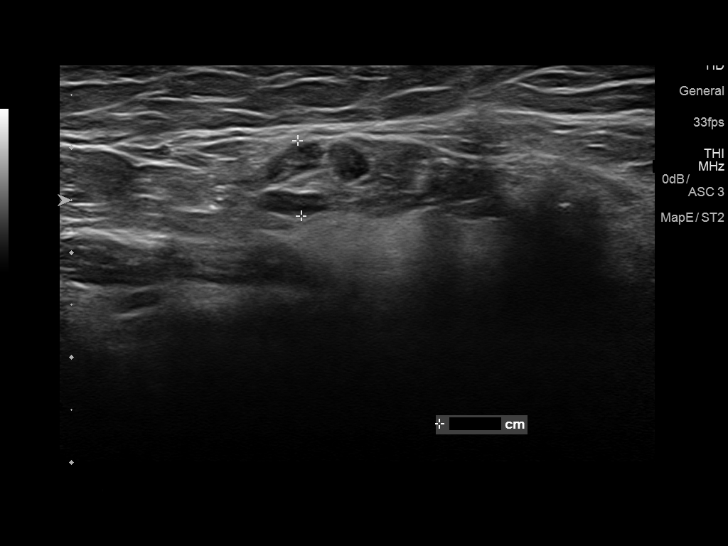
[im 8/11]
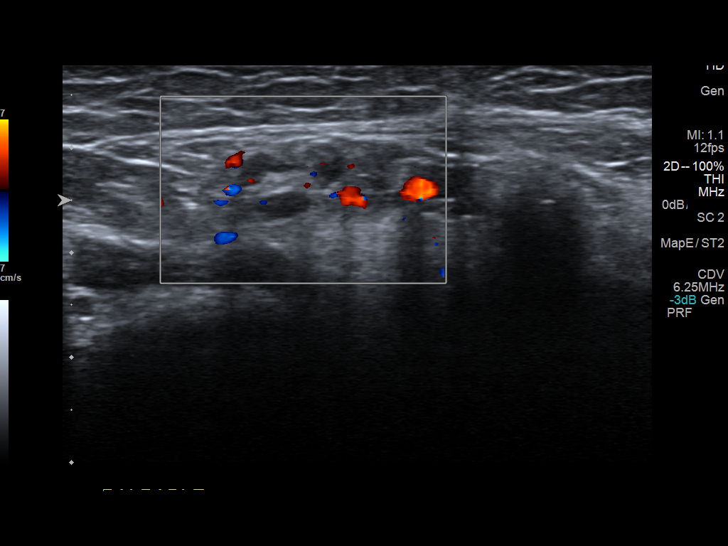
[im 9/11]
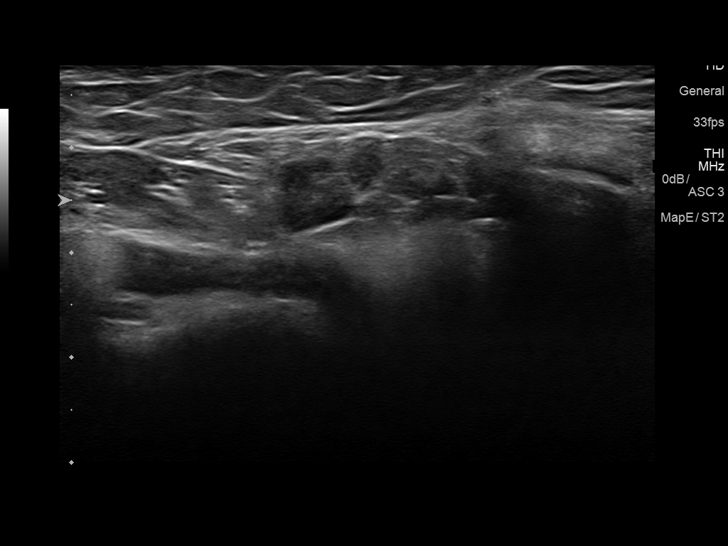
[im 10/11]
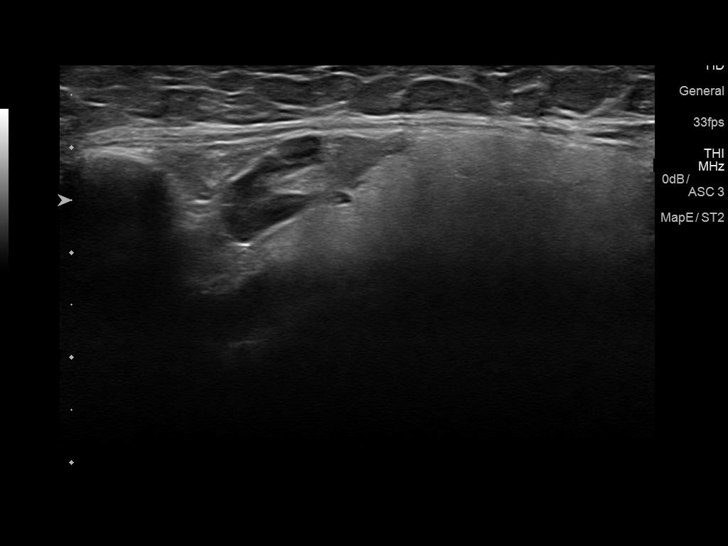
[im 11/11]
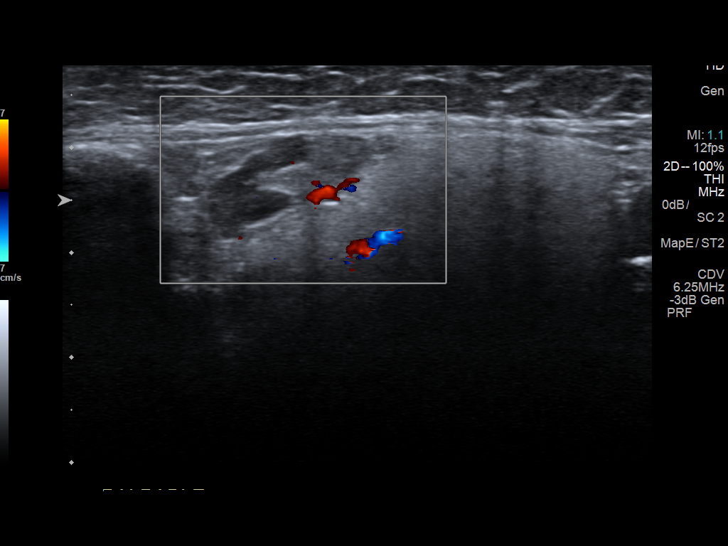

[11 of 11 positions shown; findings below may reference images not displayed]

FINDINGS: Superficial soft tissue ultrasound performed of the submandibular
palpable nodules bilaterally. These correlate with benign-appearing
superficial lymph nodes with preserved hypoechoic cortex and fatty
hila. Short axis measurements are 7 mm or less. No other significant
cystic or solid mass in the region of concern. No abnormal
adenopathy.
IMPRESSION: Palpable submandibular abnormalities correlate with benign-appearing
lymph nodes.

## 2019-04-04 ENCOUNTER — Telehealth: Payer: Self-pay | Admitting: Primary Care

## 2019-04-04 NOTE — Telephone Encounter (Signed)
Sent to [facility]. Carollee Herter and Sha[place]n were not available at the time of call.

## 2019-04-04 NOTE — Telephone Encounter (Signed)
Patient called to schedule labs on Friday 2/5 Patient wanted to know if there is anyway we can schedule her for the shingles vaccine also.  Can you see if we can open a slot for the patient?

## 2019-04-06 ENCOUNTER — Other Ambulatory Visit (INDEPENDENT_AMBULATORY_CARE_PROVIDER_SITE_OTHER): Payer: BC Managed Care – PPO

## 2019-04-06 ENCOUNTER — Ambulatory Visit (INDEPENDENT_AMBULATORY_CARE_PROVIDER_SITE_OTHER): Payer: BC Managed Care – PPO

## 2019-04-06 ENCOUNTER — Other Ambulatory Visit: Payer: Self-pay

## 2019-04-06 DIAGNOSIS — Z0000 Encounter for general adult medical examination without abnormal findings: Secondary | ICD-10-CM | POA: Diagnosis not present

## 2019-04-06 DIAGNOSIS — F411 Generalized anxiety disorder: Secondary | ICD-10-CM | POA: Diagnosis not present

## 2019-04-06 DIAGNOSIS — I10 Essential (primary) hypertension: Secondary | ICD-10-CM | POA: Diagnosis not present

## 2019-04-06 DIAGNOSIS — Z23 Encounter for immunization: Secondary | ICD-10-CM

## 2019-04-06 DIAGNOSIS — Z1211 Encounter for screening for malignant neoplasm of colon: Secondary | ICD-10-CM | POA: Diagnosis not present

## 2019-04-06 LAB — LIPID PANEL
Cholesterol: 176 mg/dL (ref 0–200)
HDL: 49.8 mg/dL (ref >39.00)
LDL Cholesterol: 97 mg/dL (ref 0–99)
NonHDL: 125.99
Total CHOL/HDL Ratio: 4
Triglycerides: 144 mg/dL (ref 0.0–149.0)
VLDL: 28.8 mg/dL (ref 0.0–40.0)

## 2019-04-06 LAB — COMPREHENSIVE METABOLIC PANEL
ALT: 26 U/L (ref 0–35)
AST: 21 U/L (ref 0–37)
Albumin: 4.2 g/dL (ref 3.5–5.2)
Alkaline Phosphatase: 102 U/L (ref 39–117)
BUN: 18 mg/dL (ref 6–23)
CO2: 30 mEq/L (ref 19–32)
Calcium: 9.5 mg/dL (ref 8.4–10.5)
Chloride: 101 mEq/L (ref 96–112)
Creatinine, Ser: 0.85 mg/dL (ref 0.40–1.20)
GFR: 70.47 mL/min (ref >60.00)
Glucose, Bld: 113 mg/dL — ABNORMAL HIGH (ref 70–99)
Potassium: 3.5 mEq/L (ref 3.5–5.1)
Sodium: 139 mEq/L (ref 135–145)
Total Bilirubin: 0.7 mg/dL (ref 0.2–1.2)
Total Protein: 7.1 g/dL (ref 6.0–8.3)

## 2019-04-06 NOTE — Progress Notes (Signed)
Per orders of Mayra Reel, NP, injection of flu vaccine and shingrix given by Sherrie George. Patient tolerated injection well.

## 2019-04-06 NOTE — Telephone Encounter (Signed)
Patient appointment was set up for today 2/5 and patient made aware.  Thanks.

## 2019-04-23 ENCOUNTER — Other Ambulatory Visit: Payer: Self-pay

## 2019-04-23 MED ORDER — PANTOPRAZOLE SODIUM 40 MG PO TBEC: 40.0000 mg | DELAYED_RELEASE_TABLET | Freq: Every day | ORAL | 3 refills | Status: DC

## 2019-06-05 ENCOUNTER — Ambulatory Visit: Payer: BC Managed Care – PPO | Admitting: Internal Medicine

## 2019-08-21 ENCOUNTER — Ambulatory Visit (INDEPENDENT_AMBULATORY_CARE_PROVIDER_SITE_OTHER): Payer: BC Managed Care – PPO | Admitting: *Deleted

## 2019-08-21 DIAGNOSIS — Z23 Encounter for immunization: Secondary | ICD-10-CM | POA: Diagnosis not present

## 2019-08-21 NOTE — Progress Notes (Signed)
Per orders of Kate Clark, NP, injection of Shingrix #2 given by Ellysa Parrack M. Patient tolerated injection well.  

## 2019-08-22 ENCOUNTER — Ambulatory Visit: Payer: BC Managed Care – PPO | Admitting: Orthopedic Surgery

## 2019-08-25 ENCOUNTER — Other Ambulatory Visit: Payer: Self-pay | Admitting: Primary Care

## 2019-08-25 DIAGNOSIS — I10 Essential (primary) hypertension: Secondary | ICD-10-CM

## 2019-10-15 ENCOUNTER — Other Ambulatory Visit: Payer: Self-pay | Admitting: Primary Care

## 2019-10-15 DIAGNOSIS — F411 Generalized anxiety disorder: Secondary | ICD-10-CM

## 2019-10-26 DIAGNOSIS — H02822 Cysts of right lower eyelid: Secondary | ICD-10-CM | POA: Diagnosis not present

## 2019-10-26 DIAGNOSIS — D2310 Other benign neoplasm of skin of unspecified eyelid, including canthus: Secondary | ICD-10-CM | POA: Diagnosis not present

## 2019-11-06 ENCOUNTER — Other Ambulatory Visit: Payer: Self-pay | Admitting: Primary Care

## 2019-11-06 DIAGNOSIS — I10 Essential (primary) hypertension: Secondary | ICD-10-CM

## 2020-01-09 ENCOUNTER — Other Ambulatory Visit: Payer: Self-pay | Admitting: Primary Care

## 2020-01-09 DIAGNOSIS — F411 Generalized anxiety disorder: Secondary | ICD-10-CM

## 2020-01-18 ENCOUNTER — Encounter (INDEPENDENT_AMBULATORY_CARE_PROVIDER_SITE_OTHER): Payer: Self-pay

## 2020-02-01 ENCOUNTER — Other Ambulatory Visit: Payer: Self-pay | Admitting: Primary Care

## 2020-02-01 DIAGNOSIS — I10 Essential (primary) hypertension: Secondary | ICD-10-CM

## 2020-02-14 ENCOUNTER — Other Ambulatory Visit (INDEPENDENT_AMBULATORY_CARE_PROVIDER_SITE_OTHER): Payer: BC Managed Care – PPO

## 2020-02-14 ENCOUNTER — Other Ambulatory Visit: Payer: Self-pay

## 2020-02-14 DIAGNOSIS — I10 Essential (primary) hypertension: Secondary | ICD-10-CM | POA: Diagnosis not present

## 2020-02-14 LAB — LIPID PANEL
Cholesterol: 161 mg/dL (ref 0–200)
HDL: 51.1 mg/dL (ref >39.00)
LDL Cholesterol: 87 mg/dL (ref 0–99)
NonHDL: 109.4
Total CHOL/HDL Ratio: 3
Triglycerides: 113 mg/dL (ref 0.0–149.0)
VLDL: 22.6 mg/dL (ref 0.0–40.0)

## 2020-02-14 LAB — COMPREHENSIVE METABOLIC PANEL
ALT: 27 U/L (ref 0–35)
AST: 20 U/L (ref 0–37)
Albumin: 4.3 g/dL (ref 3.5–5.2)
Alkaline Phosphatase: 114 U/L (ref 39–117)
BUN: 17 mg/dL (ref 6–23)
CO2: 32 mEq/L (ref 19–32)
Calcium: 9.7 mg/dL (ref 8.4–10.5)
Chloride: 101 mEq/L (ref 96–112)
Creatinine, Ser: 1.03 mg/dL (ref 0.40–1.20)
GFR: 62.74 mL/min (ref >60.00)
Glucose, Bld: 114 mg/dL — ABNORMAL HIGH (ref 70–99)
Potassium: 3.8 mEq/L (ref 3.5–5.1)
Sodium: 138 mEq/L (ref 135–145)
Total Bilirubin: 0.8 mg/dL (ref 0.2–1.2)
Total Protein: 7.7 g/dL (ref 6.0–8.3)

## 2020-02-14 LAB — CBC
HCT: 44.1 % (ref 36.0–46.0)
Hemoglobin: 14.7 g/dL (ref 12.0–15.0)
MCHC: 33.2 g/dL (ref 30.0–36.0)
MCV: 84 fl (ref 78.0–100.0)
Platelets: 250 10*3/uL (ref 150.0–400.0)
RBC: 5.26 Mil/uL — ABNORMAL HIGH (ref 3.87–5.11)
RDW: 13.7 % (ref 11.5–15.5)
WBC: 4.4 10*3/uL (ref 4.0–10.5)

## 2020-02-14 LAB — HEMOGLOBIN A1C: Hgb A1c MFr Bld: 6.1 % (ref 4.6–6.5)

## 2020-02-20 ENCOUNTER — Encounter: Payer: Self-pay | Admitting: Primary Care

## 2020-02-20 ENCOUNTER — Other Ambulatory Visit: Payer: Self-pay

## 2020-02-20 ENCOUNTER — Ambulatory Visit (INDEPENDENT_AMBULATORY_CARE_PROVIDER_SITE_OTHER): Payer: BC Managed Care – PPO | Admitting: Primary Care

## 2020-02-20 VITALS — BP 128/78 | HR 94 | Temp 97.7°F | Ht 65.5 in | Wt 345.0 lb

## 2020-02-20 DIAGNOSIS — M2550 Pain in unspecified joint: Secondary | ICD-10-CM

## 2020-02-20 DIAGNOSIS — I10 Essential (primary) hypertension: Secondary | ICD-10-CM

## 2020-02-20 DIAGNOSIS — Z23 Encounter for immunization: Secondary | ICD-10-CM | POA: Diagnosis not present

## 2020-02-20 DIAGNOSIS — E1165 Type 2 diabetes mellitus with hyperglycemia: Secondary | ICD-10-CM | POA: Insufficient documentation

## 2020-02-20 DIAGNOSIS — Z0001 Encounter for general adult medical examination with abnormal findings: Secondary | ICD-10-CM

## 2020-02-20 DIAGNOSIS — F411 Generalized anxiety disorder: Secondary | ICD-10-CM | POA: Diagnosis not present

## 2020-02-20 DIAGNOSIS — R7303 Prediabetes: Secondary | ICD-10-CM

## 2020-02-20 DIAGNOSIS — K219 Gastro-esophageal reflux disease without esophagitis: Secondary | ICD-10-CM

## 2020-02-20 DIAGNOSIS — R0600 Dyspnea, unspecified: Secondary | ICD-10-CM | POA: Diagnosis not present

## 2020-02-20 DIAGNOSIS — Z0000 Encounter for general adult medical examination without abnormal findings: Secondary | ICD-10-CM | POA: Diagnosis not present

## 2020-02-20 DIAGNOSIS — G8929 Other chronic pain: Secondary | ICD-10-CM | POA: Insufficient documentation

## 2020-02-20 DIAGNOSIS — R5383 Other fatigue: Secondary | ICD-10-CM | POA: Diagnosis not present

## 2020-02-20 DIAGNOSIS — M25542 Pain in joints of left hand: Secondary | ICD-10-CM

## 2020-02-20 DIAGNOSIS — M25541 Pain in joints of right hand: Secondary | ICD-10-CM

## 2020-02-20 DIAGNOSIS — J302 Other seasonal allergic rhinitis: Secondary | ICD-10-CM

## 2020-02-20 HISTORY — DX: Dyspnea, unspecified: R06.00

## 2020-02-20 LAB — URIC ACID: Uric Acid, Serum: 7.2 mg/dL — ABNORMAL HIGH (ref 2.4–7.0)

## 2020-02-20 LAB — TSH: TSH: 0.17 u[IU]/mL — ABNORMAL LOW (ref 0.35–4.50)

## 2020-02-20 LAB — VITAMIN B12: Vitamin B-12: 282 pg/mL (ref 211–911)

## 2020-02-20 LAB — VITAMIN D 25 HYDROXY (VIT D DEFICIENCY, FRACTURES): VITD: 19.14 ng/mL — ABNORMAL LOW (ref 30.00–100.00)

## 2020-02-20 MED ORDER — FLUOXETINE HCL 40 MG PO CAPS: 40.0000 mg | ORAL_CAPSULE | Freq: Every day | ORAL | 3 refills | Status: DC

## 2020-02-20 MED ORDER — HYDROCHLOROTHIAZIDE 25 MG PO TABS: ORAL_TABLET | ORAL | 3 refills | Status: DC

## 2020-02-20 NOTE — Assessment & Plan Note (Signed)
No recent concerns.  Continue to monitor.

## 2020-02-20 NOTE — Assessment & Plan Note (Addendum)
Occurring at rest and exertion, no chest pain.   Lungs today are without wheezing. She likely has sleep apnea based from HPI today. Consider weight gain and deconditioning. Doesn't seem to be cardiac related given ECG today.  ECG today with NSR with rate of 75. No ST changes, PAC/PVC. No prior ECG to compare.

## 2020-02-20 NOTE — Assessment & Plan Note (Addendum)
Above goal today, improved with recheck which is now at normal level.  Will have her set up for sleep study.  Continue HCTZ 25 mg for now. Discussed to start monitoring BP at home, report readings that are consistently at or above 135/90.  CMP reviewed.

## 2020-02-20 NOTE — Progress Notes (Signed)
Subjective:    Patient ID: Lindsey Dunlap, female    DOB: Jun 04, 1965, 54 y.o.   MRN: [id number]  HPI  This visit occurred during the SARS-CoV-2 public health emergency.  Safety protocols were in place, including screening questions prior to the visit, additional usage of staff PPE, and extensive cleaning of exam room while observing appropriate contact time as indicated for disinfecting solutions.   Lindsey Dunlap is a 54 year old female who presents today for complete physical. She would like to discuss several other issues.   She would also like to discuss chronic joint aches. Chronic to the hands bilaterally, now with an achy feeling to her bilateral upper extremities, pain with swelling to the left great toe. Uric acid level of 6.2 in March 2020, negative rheumatoid arthritis labs at the time.   She has also noticed increased exertional shortness of breath over the last 6 months. She notices shortness of breath at rest and with exertion, sometimes "it feels like I can't catch my breath". She has put on 10 pounds since last year. She is not exercising. She is planning starting up with the healthy weight and wellness center in early January 2022.  She's also reporting daytime tiredness, feels like she could fall asleep anytime during the day when resting, chronic fatigue. She does snore, does wake during the night. She's never had a sleep study. Epworth Sleepiness scale score of 12 today.  Wt Readings from Last 3 Encounters:  02/20/20 (!) 345 lb (156.5 kg)  12/11/18 299 lb (135.6 kg)  11/01/18 (!) 336 lb 2 oz (152.5 kg)     Immunizations: -Tetanus: Completed in 2013 -Influenza: Due -Shingles: Completed series -Covid-19: Completed series  Diet: She endorses a fair diet. She will be starting with healthy weight and wellness.  Exercise: She is not exercising much.  Eye exam: Completes annually  Dental exam: Completes semi-annually   Pap Smear: UTD, follows with [facility]. Mammogram: UTD, follows with [facility]. Colonoscopy: Completed in 2020, due in 2030 Hep C Screen: Due   Review of Systems  Constitutional: Negative for unexpected weight change.  HENT: Negative for rhinorrhea.   Eyes: Negative for visual disturbance.  Respiratory: Negative for cough and shortness of breath.   Cardiovascular: Negative for chest pain.  Gastrointestinal: Negative for constipation and diarrhea.  Genitourinary: Negative for difficulty urinating and menstrual problem.  Musculoskeletal: Positive for arthralgias and myalgias.       Chronic bilateral upper extremity achy feeling  Skin: Negative for rash.  Allergic/Immunologic: Negative for environmental allergies.  Neurological: Positive for headaches. Negative for dizziness.       Occasional headaches  Psychiatric/Behavioral: The patient is not nervous/anxious.        Past Medical History:  Diagnosis Date  . Anxiety   . Chickenpox   . GERD (gastroesophageal reflux disease)   . High blood pressure   . Night sweats   . Seasonal allergies      Social History   Socioeconomic History  . Marital status: Married    Spouse name: Lindsey Dunlap  . Number of children: 2  . Years of education: Not on file  . Highest education level: Not on file  Occupational History  . Not on file  Tobacco Use  . Smoking status: Former Games developer  . Smokeless tobacco: Never Used  Vaping Use  . Vaping Use: Never used  Substance and Sexual Activity  . Alcohol use: No  . Drug use: Never  . Sexual  activity: Yes  Other Topics Concern  . Not on file  Social History Narrative   Married.   2 children.   Works as a Futures trader.   Enjoys reading, working on projects   Social Determinants of Corporate investment banker Strain: Not on file  Food Insecurity: Not on file  Transportation Needs: Not on file  Physical Activity: Not on file  Stress: Not on file  Social Connections: Not on file  Intimate Partner Violence: Not on file     Past Surgical History:  Procedure Laterality Date  . COLONOSCOPY WITH PROPOFOL N/A 12/12/2018   Procedure: COLONOSCOPY WITH PROPOFOL;  Surgeon: Hilarie Fredrickson, MD;  Location: [facility] ENDOSCOPY;  Service: Endoscopy;  Laterality: N/A;  . TONSILLECTOMY  1991    Family History  Problem Relation Age of Onset  . Hyperlipidemia Mother   . Hypertension Mother   . Arthritis/Rheumatoid Sister   . Hypertension Maternal Grandmother   . Hypertension Maternal Grandfather   . Liver cancer Maternal Uncle   . Uterine cancer Neg Hx   . Colon polyps Neg Hx   . Esophageal cancer Neg Hx   . Pancreatic cancer Neg Hx   . Stomach cancer Neg Hx   . Rectal cancer Neg Hx     No Known Allergies  Current Outpatient Medications on File Prior to Visit  Medication Sig Dispense Refill  . albuterol (PROAIR HFA) 108 (90 Base) MCG/ACT inhaler Inhale 2 puffs into the lungs every 4 (four) hours as needed for wheezing or shortness of breath. 1 Inhaler 0  . diphenhydramine-acetaminophen (TYLENOL PM) 25-500 MG TABS tablet Take 2 tablets by mouth at bedtime.    . famotidine (PEPCID) 20 MG tablet Take 20 mg by mouth at bedtime.    Marland Kitchen FLUoxetine (PROZAC) 40 MG capsule Take 1 capsule (40 mg total) by mouth daily. For anxiety. 30 capsule 0  . hydrochlorothiazide (HYDRODIURIL) 25 MG tablet Take 1 tablet by mouth once daily for blood pressure 90 tablet 0   No current facility-administered medications on file prior to visit.    BP 140/88   Pulse 94   Temp 97.7 F (36.5 C) (Temporal)   Ht 5' 5.5" (1.664 m)   Wt (!) 345 lb (156.5 kg)   SpO2 96%   BMI 56.54 kg/m    Objective:   Physical Exam Constitutional:      Appearance: She is well-nourished.  HENT:     Right Ear: Tympanic membrane and ear canal normal.     Left Ear: Tympanic membrane and ear canal normal.     Mouth/Throat:     Mouth: Oropharynx is clear and moist.  Eyes:     Extraocular Movements: EOM normal.     Pupils: Pupils are equal, round, and  reactive to light.  Cardiovascular:     Rate and Rhythm: Normal rate and regular rhythm.  Pulmonary:     Effort: Pulmonary effort is normal.     Breath sounds: Normal breath sounds.  Abdominal:     General: Bowel sounds are normal.     Palpations: Abdomen is soft.     Tenderness: There is no abdominal tenderness.  Musculoskeletal:        General: Normal range of motion.     Cervical back: Neck supple.  Skin:    General: Skin is warm and dry.  Neurological:     Mental Status: She is alert and oriented to person, place, and time.     Cranial Nerves: No cranial  nerve deficit.     Deep Tendon Reflexes:     Reflex Scores:      Patellar reflexes are 2+ on the right side and 2+ on the left side. Psychiatric:        Mood and Affect: Mood and affect and mood normal.            Assessment & Plan:

## 2020-02-20 NOTE — Assessment & Plan Note (Signed)
Chronic, increased.   Checking labs including TSH, vitamin levels. Epworth Sleepiness Scale score of 12 today, will refer to pulmonology for sleep study.  Encouraged regular exercise, weight loss.

## 2020-02-20 NOTE — Patient Instructions (Addendum)
Stop by the lab prior to leaving today. I will notify you of your results once received.   You will be contacted regarding your referral to pulmonology.  Please let us know if you have not been contacted within two weeks.   Start exercising. You should be getting 150 minutes of moderate intensity exercise weekly.  It was a pleasure to see you today!   Preventive Care 52-52 Years Old, Female Preventive care refers to visits with your health care provider and lifestyle choices that can promote health and wellness. This includes:  A yearly physical exam. This may also be called an annual well check.  Regular dental visits and eye exams.  Immunizations.  Screening for certain conditions.  Healthy lifestyle choices, such as eating a healthy diet, getting regular exercise, not using drugs or products that contain nicotine and tobacco, and limiting alcohol use. What can I expect for my preventive care visit? Physical exam Your health care provider will check your:  Height and weight. This may be used to calculate body mass index (BMI), which tells if you are at a healthy weight.  Heart rate and blood pressure.  Skin for abnormal spots. Counseling Your health care provider may ask you questions about your:  Alcohol, tobacco, and drug use.  Emotional well-being.  Home and relationship well-being.  Sexual activity.  Eating habits.  Work and work Statistician.  Method of birth control.  Menstrual cycle.  Pregnancy history. What immunizations do I need?  Influenza (flu) vaccine  This is recommended every year. Tetanus, diphtheria, and pertussis (Tdap) vaccine  You may need a Td booster every 10 years. Varicella (chickenpox) vaccine  You may need this if you have not been vaccinated. Zoster (shingles) vaccine  You may need this after age 32. Measles, mumps, and rubella (MMR) vaccine  You may need at least one dose of MMR if you were born in 1957 or later. You may  also need a second dose. Pneumococcal conjugate (PCV13) vaccine  You may need this if you have certain conditions and were not previously vaccinated. Pneumococcal polysaccharide (PPSV23) vaccine  You may need one or two doses if you smoke cigarettes or if you have certain conditions. Meningococcal conjugate (MenACWY) vaccine  You may need this if you have certain conditions. Hepatitis A vaccine  You may need this if you have certain conditions or if you travel or work in places where you may be exposed to hepatitis A. Hepatitis B vaccine  You may need this if you have certain conditions or if you travel or work in places where you may be exposed to hepatitis B. Haemophilus influenzae type b (Hib) vaccine  You may need this if you have certain conditions. Human papillomavirus (HPV) vaccine  If recommended by your health care provider, you may need three doses over 6 months. You may receive vaccines as individual doses or as more than one vaccine together in one shot (combination vaccines). Talk with your health care provider about the risks and benefits of combination vaccines. What tests do I need? Blood tests  Lipid and cholesterol levels. These may be checked every 5 years, or more frequently if you are over 19 years old.  Hepatitis C test.  Hepatitis B test. Screening  Lung cancer screening. You may have this screening every year starting at age 62 if you have a 30-pack-year history of smoking and currently smoke or have quit within the past 15 years.  Colorectal cancer screening. All adults should have this  screening starting at age 2 and continuing until age 77. Your health care provider may recommend screening at age 49 if you are at increased risk. You will have tests every 1-10 years, depending on your results and the type of screening test.  Diabetes screening. This is done by checking your blood sugar (glucose) after you have not eaten for a while (fasting). You may  have this done every 1-3 years.  Mammogram. This may be done every 1-2 years. Talk with your health care provider about when you should start having regular mammograms. This may depend on whether you have a family history of breast cancer.  BRCA-related cancer screening. This may be done if you have a family history of breast, ovarian, tubal, or peritoneal cancers.  Pelvic exam and Pap test. This may be done every 3 years starting at age 57. Starting at age 16, this may be done every 5 years if you have a Pap test in combination with an HPV test. Other tests  Sexually transmitted disease (STD) testing.  Bone density scan. This is done to screen for osteoporosis. You may have this scan if you are at high risk for osteoporosis. Follow these instructions at home: Eating and drinking  Eat a diet that includes fresh fruits and vegetables, whole grains, lean protein, and low-fat dairy.  Take vitamin and mineral supplements as recommended by your health care provider.  Do not drink alcohol if: ? Your health care provider tells you not to drink. ? You are pregnant, may be pregnant, or are planning to become pregnant.  If you drink alcohol: ? Limit how much you have to 0-1 drink a day. ? Be aware of how much alcohol is in your drink. In the [place] one drink equals one 12 oz bottle of beer (355 mL), one 5 oz glass of wine (148 mL), or one 1 oz glass of hard liquor (44 mL). Lifestyle  Take daily care of your teeth and gums.  Stay active. Exercise for at least 30 minutes on 5 or more days each week.  Do not use any products that contain nicotine or tobacco, such as cigarettes, e-cigarettes, and chewing tobacco. If you need help quitting, ask your health care provider.  If you are sexually active, practice safe sex. Use a condom or other form of birth control (contraception) in order to prevent pregnancy and STIs (sexually transmitted infections).  If told by your health care provider, take  low-dose aspirin daily starting at age 28. What's next?  Visit your health care provider once a year for a well check visit.  Ask your health care provider how often you should have your eyes and teeth checked.  Stay up to date on all vaccines. This information is not intended to replace advice given to you by your health care provider. Make sure you discuss any questions you have with your health care provider. Document Revised: 10/27/2017 Document Reviewed: 10/27/2017 Elsevier Patient Education  [place].    Influenza (Flu) Vaccine (Inactivated or Recombinant): What You Need to Know 1. Why get vaccinated? Influenza vaccine can prevent influenza (flu). Flu is a contagious disease that spreads around the [place] every year, usually between October and May. Anyone can get the flu, but it is more dangerous for some people. Infants and young children, people 70 years of age and older, pregnant women, and people with certain health conditions or a weakened immune system are at greatest risk of flu complications. Pneumonia, bronchitis, sinus infections  and ear infections are examples of flu-related complications. If you have a medical condition, such as heart disease, cancer or diabetes, flu can make it worse. Flu can cause fever and chills, sore throat, muscle aches, fatigue, cough, headache, and runny or stuffy nose. Some people may have vomiting and diarrhea, though this is more common in children than adults. Each year thousands of people in the [place] die from flu, and many more are hospitalized. Flu vaccine prevents millions of illnesses and flu-related visits to the doctor each year. 2. Influenza vaccine CDC recommends everyone 56 months of age and older get vaccinated every flu season. Children 6 months through 39 years of age may need 2 doses during a single flu season. Everyone else needs only 1 dose each flu season. It takes about 2 weeks for protection to develop  after vaccination. There are many flu viruses, and they are always changing. Each year a new flu vaccine is made to protect against three or four viruses that are likely to cause disease in the upcoming flu season. Even when the vaccine doesn't exactly match these viruses, it may still provide some protection. Influenza vaccine does not cause flu. Influenza vaccine may be given at the same time as other vaccines. 3. Talk with your health care provider Tell your vaccine provider if the person getting the vaccine:  Has had an allergic reaction after a previous dose of influenza vaccine, or has any severe, life-threatening allergies.  Has ever had Guillain-Barr Syndrome (also called GBS). In some cases, your health care provider may decide to postpone influenza vaccination to a future visit. People with minor illnesses, such as a cold, may be vaccinated. People who are moderately or severely ill should usually wait until they recover before getting influenza vaccine. Your health care provider can give you more information. 4. Risks of a vaccine reaction  Soreness, redness, and swelling where shot is given, fever, muscle aches, and headache can happen after influenza vaccine.  There may be a very small increased risk of Guillain-Barr Syndrome (GBS) after inactivated influenza vaccine (the flu shot). Young children who get the flu shot along with pneumococcal vaccine (PCV13), and/or DTaP vaccine at the same time might be slightly more likely to have a seizure caused by fever. Tell your health care provider if a child who is getting flu vaccine has ever had a seizure. People sometimes faint after medical procedures, including vaccination. Tell your provider if you feel dizzy or have vision changes or ringing in the ears. As with any medicine, there is a very remote chance of a vaccine causing a severe allergic reaction, other serious injury, or death. 5. What if there is a serious problem? An  allergic reaction could occur after the vaccinated person leaves the clinic. If you see signs of a severe allergic reaction (hives, swelling of the face and throat, difficulty breathing, a fast heartbeat, dizziness, or weakness), call 9-1-1 and get the person to the nearest hospital. For other signs that concern you, call your health care provider. Adverse reactions should be reported to the Vaccine Adverse Event Reporting System (VAERS). Your health care provider will usually file this report, or you can do it yourself. Visit the VAERS website at www.vaers.SamedayNews.es or call (434)367-1098.VAERS is only for reporting reactions, and VAERS staff do not give medical advice. 6. The National Vaccine Injury Compensation Program The National Vaccine Injury Compensation Program (VICP) is a federal program that was created to compensate people who may have been injured  by certain vaccines. Visit the VICP website at GoldCloset.com.ee or call 973-560-3574 to learn about the program and about filing a claim. There is a time limit to file a claim for compensation. 7. How can I learn more?  Ask your healthcare provider.  Call your local or state health department.  Contact the Centers for Disease Control and Prevention (CDC): ? Call 714-104-0387 (1-800-CDC-INFO) or ? Visit CDC's https://gibson.com/ Vaccine Information Statement (Interim) Inactivated Influenza Vaccine (10/13/2017) This information is not intended to replace advice given to you by your health care provider. Make sure you discuss any questions you have with your health care provider. Document Revised: 06/06/2018 Document Reviewed: 10/17/2017 Elsevier Patient Education  [place].

## 2020-02-20 NOTE — Assessment & Plan Note (Signed)
Initially to joints of hands, now to upper and lower extremities and left great toe.  Negative RA work up last year. Will repeat Uric acid level, she is on HCTZ for hypertension.   Denies tick bites.  Encouraged weight loss.

## 2020-02-20 NOTE — Assessment & Plan Note (Addendum)
Improved since last visit, now has a part time job and her daughter is now in private school.  Continue to monitor.  Continue fluoxetine 40 mg daily.  She has noticed facial twitching since starting fluoxetine, this is a known side effect. I offered to switch to Zoloft, she kindly declines and will update if the twitching becomes bothersome. She has done very well with fluoxetine.

## 2020-02-20 NOTE — Assessment & Plan Note (Signed)
Recent A1C of 6.1 which is a new finding.  Discussed the importance of a healthy diet and regular exercise in order for weight loss, and to reduce the risk of any potential medical problems.  She will be starting up with healthy weight and wellness in January 2022. Will need to closely monitor.

## 2020-02-20 NOTE — Assessment & Plan Note (Signed)
Influenza vaccination provided today. Pap smear and mammogram UTD, follows with GYN. Colonoscopy UTD, due in 2030.  Discussed the importance of a healthy diet and regular exercise in order for weight loss, and to reduce the risk of any potential medical problems.  Exam today as noted. Labs reviewed and also pending.

## 2020-02-20 NOTE — Assessment & Plan Note (Signed)
Intermittent, worse over the year.  Negative work up for RA in 2020.

## 2020-02-20 NOTE — Assessment & Plan Note (Signed)
Chronic, overall somewhat improved with famotidine 20 mg HS. She is aware of GERD triggers.

## 2020-02-21 ENCOUNTER — Other Ambulatory Visit: Payer: Self-pay | Admitting: Primary Care

## 2020-02-21 ENCOUNTER — Other Ambulatory Visit (INDEPENDENT_AMBULATORY_CARE_PROVIDER_SITE_OTHER): Payer: BC Managed Care – PPO

## 2020-02-21 DIAGNOSIS — R7989 Other specified abnormal findings of blood chemistry: Secondary | ICD-10-CM

## 2020-02-21 DIAGNOSIS — M1A9XX0 Chronic gout, unspecified, without tophus (tophi): Secondary | ICD-10-CM

## 2020-02-21 DIAGNOSIS — E559 Vitamin D deficiency, unspecified: Secondary | ICD-10-CM

## 2020-02-21 LAB — T4, FREE: Free T4: 0.77 ng/dL (ref 0.60–1.60)

## 2020-02-21 MED ORDER — VITAMIN D (ERGOCALCIFEROL) 1.25 MG (50000 UNIT) PO CAPS: ORAL_CAPSULE | ORAL | 0 refills | Status: DC

## 2020-02-21 MED ORDER — ALLOPURINOL 100 MG PO TABS: 100.0000 mg | ORAL_TABLET | Freq: Every day | ORAL | 1 refills | Status: DC

## 2020-03-12 ENCOUNTER — Other Ambulatory Visit: Payer: Self-pay

## 2020-03-12 ENCOUNTER — Ambulatory Visit (INDEPENDENT_AMBULATORY_CARE_PROVIDER_SITE_OTHER): Payer: BC Managed Care – PPO | Admitting: Bariatrics

## 2020-03-12 ENCOUNTER — Encounter (INDEPENDENT_AMBULATORY_CARE_PROVIDER_SITE_OTHER): Payer: Self-pay | Admitting: Bariatrics

## 2020-03-12 VITALS — BP 122/75 | HR 79 | Temp 98.4°F | Ht 66.0 in | Wt 342.0 lb

## 2020-03-12 DIAGNOSIS — R0683 Snoring: Secondary | ICD-10-CM

## 2020-03-12 DIAGNOSIS — Z9189 Other specified personal risk factors, not elsewhere classified: Secondary | ICD-10-CM | POA: Diagnosis not present

## 2020-03-12 DIAGNOSIS — Z0289 Encounter for other administrative examinations: Secondary | ICD-10-CM

## 2020-03-12 DIAGNOSIS — R7303 Prediabetes: Secondary | ICD-10-CM | POA: Diagnosis not present

## 2020-03-12 DIAGNOSIS — Z6841 Body Mass Index (BMI) 40.0 and over, adult: Secondary | ICD-10-CM

## 2020-03-12 DIAGNOSIS — R0602 Shortness of breath: Secondary | ICD-10-CM | POA: Diagnosis not present

## 2020-03-12 DIAGNOSIS — E559 Vitamin D deficiency, unspecified: Secondary | ICD-10-CM | POA: Diagnosis not present

## 2020-03-12 DIAGNOSIS — M25562 Pain in left knee: Secondary | ICD-10-CM

## 2020-03-12 DIAGNOSIS — I10 Essential (primary) hypertension: Secondary | ICD-10-CM | POA: Diagnosis not present

## 2020-03-12 DIAGNOSIS — Z1331 Encounter for screening for depression: Secondary | ICD-10-CM | POA: Diagnosis not present

## 2020-03-12 DIAGNOSIS — M25561 Pain in right knee: Secondary | ICD-10-CM

## 2020-03-12 DIAGNOSIS — F5089 Other specified eating disorder: Secondary | ICD-10-CM

## 2020-03-12 DIAGNOSIS — R5383 Other fatigue: Secondary | ICD-10-CM

## 2020-03-12 DIAGNOSIS — K5909 Other constipation: Secondary | ICD-10-CM

## 2020-03-12 NOTE — Telephone Encounter (Signed)
Lindsey Dunlap, can you take a look at her sleep study referral?

## 2020-03-13 LAB — INSULIN, RANDOM: INSULIN: 33 u[IU]/mL — ABNORMAL HIGH (ref 2.6–24.9)

## 2020-03-14 ENCOUNTER — Encounter (INDEPENDENT_AMBULATORY_CARE_PROVIDER_SITE_OTHER): Payer: Self-pay | Admitting: Bariatrics

## 2020-03-17 NOTE — Telephone Encounter (Signed)
Last OV with Dr Brown 

## 2020-03-17 NOTE — Progress Notes (Signed)
Chief Complaint:   OBESITY Lindsey Dunlap (MR# [id number]) is a 55 y.o. female who presents for evaluation and treatment of obesity and related comorbidities. Current BMI is Body mass index is 55.2 kg/m. Lindsey Dunlap has been struggling with her weight for many years and has been unsuccessful in either losing weight, maintaining weight loss, or reaching her healthy weight goal.  Lindsey Dunlap is currently in the action stage of change and ready to dedicate time achieving and maintaining a healthier weight. Lindsey Dunlap is interested in becoming our patient and working on intensive lifestyle modifications including (but not limited to) diet and exercise for weight loss.  Lindsey Dunlap says that she does like to cook, but notes energy and "pleasing" everyone as an obstacle.  She craves carbohydrates.    Lindsey Dunlap's habits were reviewed today and are as follows: Her family eats meals together, she thinks her family will eat healthier with her, she struggles with family and or coworkers weight loss sabotage, her desired weight loss is 100 pounds, she has been heavy most of her life, she started gaining weight after children, marriage, getting older, her heaviest weight ever was her current weight - 345 pounds, she craves potatoes, chocolate, and cake, she snacks frequently in the evenings, she frequently eats larger portions than normal and she struggles with emotional eating.  Depression Screen Lindsey Dunlap's Food and Mood (modified PHQ-9) score was 14.  Depression screen [facility] 2/9 03/12/2020  Decreased Interest 2  Down, Depressed, Hopeless 1  PHQ - 2 Score 3  Altered sleeping 3  Tired, decreased energy 3  Change in appetite 2  Feeling bad or failure about yourself  1  Trouble concentrating 1  Moving slowly or fidgety/restless 1  Suicidal thoughts 0  PHQ-9 Score 14  Difficult doing work/chores Not difficult at all   Subjective:   1. Other fatigue Gray admits to daytime somnolence and reports waking up still tired. Patent has a  history of symptoms of daytime fatigue, morning fatigue, morning headache and snoring. [name] generally gets 6 or 7 hours of sleep per night, and states that she has poor quality sleep. Snoring is present. Apneic episodes are not present. Epworth Sleepiness Score is 8.  2. SOB (shortness of breath) on exertion Lindsey Dunlap notes increasing shortness of breath with exercising and seems to be worsening over time with weight gain. She notes getting out of breath sooner with activity than she used to. This has gotten worse recently. Lindsey Dunlap denies shortness of breath at rest or orthopnea.  3. Essential hypertension Blood pressure slightly elevated today.  She is taking HCTZ.  Review: taking medications as instructed, no medication side effects noted, no chest pain on exertion, no dyspnea on exertion, no swelling of ankles.   BP Readings from Last 3 Encounters:  03/12/20 122/75  02/20/20 128/78  12/12/18 120/64   4. Pain in both knees, unspecified chronicity Right greater than left.  Xrayed. Minimal cartilage.  5. Vitamin D deficiency Lindsey Dunlap's Vitamin D level was 19.14 on 02/20/2020. She is currently taking prescription vitamin D 50,000 IU each week. She denies nausea, vomiting or muscle weakness.  6. Prediabetes [name] has a diagnosis of prediabetes based on her elevated HgA1c and was informed this puts her at greater risk of developing diabetes. She continues to work on diet and exercise to decrease her risk of diabetes. She denies nausea or hypoglycemia.  Lab Results  Component Value Date   HGBA1C 6.1 02/14/2020   Lab Results  Component Value Date  INSULIN 33.0 (H) 03/12/2020   7. Other constipation Lindsey Dunlap notes constipation. This is chronic.  8. Snoring She does not have apneic episodes.  9. Other disorder of eating Minor.    10. Depression screening Lindsey Dunlap was screened for depression today as part of her new patient workup.  PHQ-9 is 14.  11. At risk for activity intolerance Lindsey Dunlap is at risk  for activity intolerance due to obesity and weather changes.  Assessment/Plan:   1. Other fatigue Lindsey Dunlap does feel that her weight is causing her energy to be lower than it should be. Fatigue may be related to obesity, depression or many other causes. Labs will be ordered, and in the meanwhile, Lindsey Dunlap will focus on self care including making healthy food choices, increasing physical activity and focusing on stress reduction.  - Insulin, random  2. SOB (shortness of breath) on exertion Lindsey Dunlap does feel that she gets out of breath more easily that she used to when she exercises. Lindsey Dunlap's shortness of breath appears to be obesity related and exercise induced. She has agreed to work on weight loss and gradually increase exercise to treat her exercise induced shortness of breath. Will continue to monitor closely.    3. Essential hypertension Lindsey Dunlap is working on healthy weight loss and exercise to improve blood pressure control. We will watch for signs of hypotension as she continues her lifestyle modifications.  She will continue HCTZ.  4. Pain in both knees, unspecified chronicity No pounding exercises.  I have encouraged walking.  5. Vitamin D deficiency Low Vitamin D level contributes to fatigue and are associated with obesity, breast, and colon cancer. She agrees to continue to take prescription Vitamin D @50 ,000 IU every week and will follow-up for routine testing of Vitamin D, at least 2-3 times per year to avoid over-replacement.  6. Prediabetes Lindsey Dunlap will continue to work on weight loss, exercise, and decreasing simple carbohydrates to help decrease the risk of diabetes.   - Insulin, random  7. Other constipation Lindsey Dunlap was informed that a decrease in bowel movement frequency is normal while losing weight, but stools should not be hard or painful. Recommend the maximum OTC dose of Citrucel or MiraLAX several times per week.   8. Snoring Lindsey Dunlap will see her PCP to set up a sleep study.  9. Other  disorder of eating Handout given on emotional hunger.  10. Depression screening Lindsey Dunlap had a positive depression screening. Depression is commonly associated with obesity and often results in emotional eating behaviors. We will monitor this closely and work on CBT to help improve the non-hunger eating patterns. Referral to Psychology may be required if no improvement is seen as she continues in our clinic.  11. At risk for activity intolerance Riana was given approximately 15 minutes of exercise intolerance counseling today. She is 55 y.o. female and has risk factors exercise intolerance including obesity. We discussed intensive lifestyle modifications today with an emphasis on specific weight loss instructions and strategies. Keiara will slowly increase activity as tolerated.  Repetitive spaced learning was employed today to elicit superior memory formation and behavioral change.  12. Class 3 severe obesity with serious comorbidity and body mass index (BMI) of 50.0 to 59.9 in adult, unspecified obesity type [facility])  Ladene is currently in the action stage of change and her goal is to continue with weight loss efforts. I recommend Mohogany begin the structured treatment plan as follows:  She has agreed to the Category 4 Plan.  She will work on meal planning,  mindful eating, and stop all regular sodas.  Reviewed labs from 02/14/2020, including CMP, glucose (114), lipid panel, CBC, and A1c.  Exercise goals: No exercise has been prescribed at this time.   Behavioral modification strategies: increasing lean protein intake, decreasing simple carbohydrates, increasing vegetables, increasing water intake, decreasing eating out, no skipping meals, meal planning and cooking strategies, keeping healthy foods in the home and planning for success.  She was informed of the importance of frequent follow-up visits to maximize her success with intensive lifestyle modifications for her multiple health conditions. She was  informed we would discuss her lab results at her next visit unless there is a critical issue that needs to be addressed sooner. Angeliki agreed to keep her next visit at the agreed upon time to discuss these results.  Objective:   Blood pressure 122/75, pulse 79, temperature 98.4 F (36.9 C), height 5\' 6"  (1.676 m), weight (!) 342 lb (155.1 kg), SpO2 96 %. Body mass index is 55.2 kg/m.  EKG: Recently performed on 02/20/2020.  Indirect Calorimeter completed today shows a VO2 of 430 and a REE of 2997.  Her calculated basal metabolic rate is 2998 thus her basal metabolic rate is better than expected.  General: Cooperative, alert, well developed, in no acute distress. HEENT: Conjunctivae and lids unremarkable. Cardiovascular: Regular rhythm.  Lungs: Normal work of breathing. Neurologic: No focal deficits.   Lab Results  Component Value Date   CREATININE 1.03 02/14/2020   BUN 17 02/14/2020   NA 138 02/14/2020   K 3.8 02/14/2020   CL 101 02/14/2020   CO2 32 02/14/2020   Lab Results  Component Value Date   ALT 27 02/14/2020   AST 20 02/14/2020   ALKPHOS 114 02/14/2020   BILITOT 0.8 02/14/2020   Lab Results  Component Value Date   HGBA1C 6.1 02/14/2020   HGBA1C 5.6 05/01/2018   HGBA1C 5.3 01/26/2016   Lab Results  Component Value Date   TSH 0.17 (L) 02/20/2020   Lab Results  Component Value Date   CHOL 161 02/14/2020   HDL 51.10 02/14/2020   LDLCALC 87 02/14/2020   TRIG 113.0 02/14/2020   CHOLHDL 3 02/14/2020   Lab Results  Component Value Date   WBC 4.4 02/14/2020   HGB 14.7 02/14/2020   HCT 44.1 02/14/2020   MCV 84.0 02/14/2020   PLT 250.0 02/14/2020   Attestation Statements:   Reviewed by clinician on day of visit: allergies, medications, problem list, medical history, surgical history, family history, social history, and previous encounter notes.  I, 02/16/2020, CMA, am acting as Insurance claims handler for Energy manager, DO  I have reviewed the above documentation  for accuracy and completeness, and I agree with the above. [employer], DO

## 2020-03-17 NOTE — Telephone Encounter (Signed)
Please review

## 2020-03-19 ENCOUNTER — Encounter (INDEPENDENT_AMBULATORY_CARE_PROVIDER_SITE_OTHER): Payer: Self-pay | Admitting: Bariatrics

## 2020-03-26 ENCOUNTER — Other Ambulatory Visit: Payer: Self-pay

## 2020-03-26 ENCOUNTER — Encounter (INDEPENDENT_AMBULATORY_CARE_PROVIDER_SITE_OTHER): Payer: Self-pay | Admitting: Bariatrics

## 2020-03-26 ENCOUNTER — Ambulatory Visit (INDEPENDENT_AMBULATORY_CARE_PROVIDER_SITE_OTHER): Payer: BLUE CROSS/BLUE SHIELD | Admitting: Bariatrics

## 2020-03-26 VITALS — BP 138/85 | HR 98 | Temp 98.4°F | Ht 66.0 in | Wt 338.0 lb

## 2020-03-26 DIAGNOSIS — E559 Vitamin D deficiency, unspecified: Secondary | ICD-10-CM | POA: Diagnosis not present

## 2020-03-26 DIAGNOSIS — Z6841 Body Mass Index (BMI) 40.0 and over, adult: Secondary | ICD-10-CM

## 2020-03-26 DIAGNOSIS — Z9189 Other specified personal risk factors, not elsewhere classified: Secondary | ICD-10-CM | POA: Diagnosis not present

## 2020-03-26 DIAGNOSIS — R7303 Prediabetes: Secondary | ICD-10-CM | POA: Diagnosis not present

## 2020-03-26 MED ORDER — METFORMIN HCL 500 MG PO TABS: 500.0000 mg | ORAL_TABLET | Freq: Every day | ORAL | 0 refills | Status: DC

## 2020-03-27 ENCOUNTER — Encounter (INDEPENDENT_AMBULATORY_CARE_PROVIDER_SITE_OTHER): Payer: Self-pay | Admitting: Bariatrics

## 2020-03-27 NOTE — Progress Notes (Signed)
Chief Complaint:   OBESITY Lindsey Dunlap is here to discuss her progress with her obesity treatment plan along with follow-up of her obesity related diagnoses. Lindsey Dunlap is on the Category 4 Plan and states she is following her eating plan approximately 60% of the time. Lindsey Dunlap states she is not exercising regularly at this time.  Today's visit was #: 2 Starting weight: 342 lbs Starting date: 03/12/2020 Today's weight: 338 lbs Today's date: 03/26/2020 Total lbs lost to date: 4 lbs Total lbs lost since last in-office visit: 4 lbs  Interim History: Lindsey Dunlap is down 4 pounds since her last visit.  The diet is "very hard".  She has struggled with not drinking regular soda.  Subjective:   1. Vitamin D deficiency Lindsey Dunlap's Vitamin D level was 19.14 on 02/20/2020. She is currently taking prescription vitamin D 50,000 IU each week. She denies nausea, vomiting or muscle weakness.  2. Prediabetes Lindsey Dunlap has a diagnosis of prediabetes based on her elevated HgA1c and was informed this puts her at greater risk of developing diabetes. She continues to work on diet and exercise to decrease her risk of diabetes. She denies nausea or hypoglycemia.  She is on no medications.  A1c 6.1, insulin level 33.0.  Lab Results  Component Value Date   HGBA1C 6.1 02/14/2020   Lab Results  Component Value Date   INSULIN 33.0 (H) 03/12/2020   3. At risk of diabetes mellitus Lindsey Dunlap is at higher than average risk for developing diabetes due to obesity and prediabetes.   Assessment/Plan:   1. Vitamin D deficiency Low Vitamin D level contributes to fatigue and are associated with obesity, breast, and colon cancer. She agrees to continue to take prescription Vitamin D @50 ,000 IU every week.  2. Prediabetes Lindsey Dunlap will continue to work on weight loss, exercise, and decreasing simple carbohydrates to help decrease the risk of diabetes.  Handout provided on prediabetes.  Will start metformin 500 mg daily with food, as per below.  -Start  metFORMIN (GLUCOPHAGE) 500 MG tablet; Take 1 tablet (500 mg total) by mouth daily with breakfast.  Dispense: 30 tablet; Refill: 0  3. At risk of diabetes mellitus Lindsey Dunlap was given approximately 15 minutes of diabetes education and counseling today. We discussed intensive lifestyle modifications today with an emphasis on weight loss as well as increasing exercise and decreasing simple carbohydrates in her diet. We also reviewed medication options with an emphasis on risk versus benefit of those discussed.   Repetitive spaced learning was employed today to elicit superior memory formation and behavioral change.  4. Class 3 severe obesity with serious comorbidity and body mass index (BMI) of 50.0 to 59.9 in adult, unspecified obesity type [facility])  Lindsey Dunlap is currently in the action stage of change. As such, her goal is to continue with weight loss efforts. She has agreed to the Category 4 Plan.  She will work on meal planning and will be adherent to the plan.  Handouts were provided for Protein Equivalents and Lunch and Dinner Options.  Labs from 02/14/2020 and 02/20/2020 were reviewed, including vitamin D and A1c.  Exercise goals: All adults should avoid inactivity. Some physical activity is better than none, and adults who participate in any amount of physical activity gain some health benefits.  Behavioral modification strategies: increasing lean protein intake, decreasing simple carbohydrates, increasing vegetables, increasing water intake, decreasing eating out, no skipping meals, meal planning and cooking strategies, keeping healthy foods in the home and planning for success.  Lindsey Dunlap has agreed  to follow-up with our clinic in 2 weeks. She was informed of the importance of frequent follow-up visits to maximize her success with intensive lifestyle modifications for her multiple health conditions.   Objective:   Blood pressure 138/85, pulse 98, temperature 98.4 F (36.9 C), height 5\' 6"  (1.676 m),  weight (!) 338 lb (153.3 kg), SpO2 97 %. Body mass index is 54.55 kg/m.  General: Cooperative, alert, well developed, in no acute distress. HEENT: Conjunctivae and lids unremarkable. Cardiovascular: Regular rhythm.  Lungs: Normal work of breathing. Neurologic: No focal deficits.   Lab Results  Component Value Date   CREATININE 1.03 02/14/2020   BUN 17 02/14/2020   NA 138 02/14/2020   K 3.8 02/14/2020   CL 101 02/14/2020   CO2 32 02/14/2020   Lab Results  Component Value Date   ALT 27 02/14/2020   AST 20 02/14/2020   ALKPHOS 114 02/14/2020   BILITOT 0.8 02/14/2020   Lab Results  Component Value Date   HGBA1C 6.1 02/14/2020   HGBA1C 5.6 05/01/2018   HGBA1C 5.3 01/26/2016   Lab Results  Component Value Date   INSULIN 33.0 (H) 03/12/2020   Lab Results  Component Value Date   TSH 0.17 (L) 02/20/2020   Lab Results  Component Value Date   CHOL 161 02/14/2020   HDL 51.10 02/14/2020   LDLCALC 87 02/14/2020   TRIG 113.0 02/14/2020   CHOLHDL 3 02/14/2020   Lab Results  Component Value Date   WBC 4.4 02/14/2020   HGB 14.7 02/14/2020   HCT 44.1 02/14/2020   MCV 84.0 02/14/2020   PLT 250.0 02/14/2020   Attestation Statements:   Reviewed by clinician on day of visit: allergies, medications, problem list, medical history, surgical history, family history, social history, and previous encounter notes.  I, 02/16/2020, CMA, am acting as Insurance claims handler for Energy manager, DO  I have reviewed the above documentation for accuracy and completeness, and I agree with the above. [employer], DO

## 2020-04-08 ENCOUNTER — Encounter (INDEPENDENT_AMBULATORY_CARE_PROVIDER_SITE_OTHER): Payer: Self-pay | Admitting: Bariatrics

## 2020-04-08 ENCOUNTER — Other Ambulatory Visit: Payer: Self-pay

## 2020-04-08 ENCOUNTER — Ambulatory Visit (INDEPENDENT_AMBULATORY_CARE_PROVIDER_SITE_OTHER): Payer: BC Managed Care – PPO | Admitting: Bariatrics

## 2020-04-08 VITALS — BP 135/84 | HR 79 | Temp 98.2°F | Ht 66.0 in | Wt 336.0 lb

## 2020-04-08 DIAGNOSIS — R7303 Prediabetes: Secondary | ICD-10-CM | POA: Diagnosis not present

## 2020-04-08 DIAGNOSIS — R0683 Snoring: Secondary | ICD-10-CM

## 2020-04-08 DIAGNOSIS — Z9189 Other specified personal risk factors, not elsewhere classified: Secondary | ICD-10-CM | POA: Diagnosis not present

## 2020-04-08 DIAGNOSIS — Z6841 Body Mass Index (BMI) 40.0 and over, adult: Secondary | ICD-10-CM

## 2020-04-08 DIAGNOSIS — E559 Vitamin D deficiency, unspecified: Secondary | ICD-10-CM

## 2020-04-08 MED ORDER — METFORMIN HCL 500 MG PO TABS: 500.0000 mg | ORAL_TABLET | Freq: Every day | ORAL | 0 refills | Status: DC

## 2020-04-10 NOTE — Progress Notes (Signed)
Chief Complaint:   OBESITY Lindsey Dunlap is here to discuss her progress with her obesity treatment plan along with follow-up of her obesity related diagnoses. Lindsey Dunlap is on the Category 4 Plan and states she is following her eating plan approximately 30-40% of the time. Lindsey Dunlap states she is doing house work and painting for 4-5 hours 1 time per week.  Today's visit was #: 3 Starting weight: 342 lbs Starting date: 03/12/2020 Today's weight: 336 lbs Today's date: 04/08/2020 Total lbs lost to date: 6 lbs Total lbs lost since last in-office visit: 2 lbs  Interim History: Lindsey Dunlap is down an additional 2 pounds. She has stopped all regular soda.  Subjective:   1. Prediabetes Lindsey Dunlap has a diagnosis of prediabetes based on her elevated HgA1c and was informed this puts her at greater risk of developing diabetes. She continues to work on diet and exercise to decrease her risk of diabetes. She denies nausea or hypoglycemia.  She is taking metformin 500 mg daily.  Denies problems with metformin.  Lab Results  Component Value Date   HGBA1C 6.1 02/14/2020   Lab Results  Component Value Date   INSULIN 33.0 (H) 03/12/2020   2. Vitamin D deficiency Lindsey Dunlap's Vitamin D level was 19.14 on 02/20/2020. She is currently taking prescription vitamin D 50,000 IU each week. She denies nausea, vomiting or muscle weakness.  She gets minimal sun exposure.  3. Snoring Lindsey Dunlap has an appointment scheduled for a sleep study.  4. At risk for hypoglycemia Lindsey Dunlap is at increased risk for hypoglycemia due to changes in diet and prediabetes diagnosis.  Assessment/Plan:   1. Prediabetes Lindsey Dunlap will continue to work on weight loss, exercise, and decreasing simple carbohydrates to help decrease the risk of diabetes.    - Refill metFORMIN (GLUCOPHAGE) 500 MG tablet; Take 1 tablet (500 mg total) by mouth daily with breakfast.  Dispense: 30 tablet; Refill: 0  2. Vitamin D deficiency Low Vitamin D level contributes to fatigue and are  associated with obesity, breast, and colon cancer. She agrees to continue to take prescription Vitamin D @50 ,000 IU every week and will follow-up for routine testing of Vitamin D, at least 2-3 times per year to avoid over-replacement.  3. Snoring Will go for sleep study in the near future (scheduled).  4. At risk for hypoglycemia Lindsey Dunlap was given approximately 15 minutes of counseling today regarding prevention of hypoglycemia. She was advised of symptoms of hypoglycemia. Lindsey Dunlap was instructed to avoid skipping meals, eat regular protein rich meals and schedule low calorie snacks as needed.   Repetitive spaced learning was employed today to elicit superior memory formation and behavioral change  5. Class 3 severe obesity with serious comorbidity and body mass index (BMI) of 50.0 to 59.9 in adult, unspecified obesity type [facility])  Dunlap is currently in the action stage of change. As such, her goal is to continue with weight loss efforts. She has agreed to the Category 4 Plan.   She will work on meal planning and intentional eating.  Exercise goals: For substantial health benefits, adults should do at least 150 minutes (2 hours and 30 minutes) a week of moderate-intensity, or 75 minutes (1 hour and 15 minutes) a week of vigorous-intensity aerobic physical activity, or an equivalent combination of moderate- and vigorous-intensity aerobic activity. Aerobic activity should be performed in episodes of at least 10 minutes, and preferably, it should be spread throughout the week.  Behavioral modification strategies: increasing lean protein intake, decreasing simple carbohydrates, increasing vegetables,  increasing water intake, decreasing eating out, no skipping meals, meal planning and cooking strategies, keeping healthy foods in the home and planning for success.  Lindsey Dunlap has agreed to follow-up with our clinic in 2 weeks. She was informed of the importance of frequent follow-up visits to maximize her success  with intensive lifestyle modifications for her multiple health conditions.   Objective:   Blood pressure 135/84, pulse 79, temperature 98.2 F (36.8 C), height 5\' 6"  (1.676 m), weight (!) 336 lb (152.4 kg), SpO2 97 %. Body mass index is 54.23 kg/m.  General: Cooperative, alert, well developed, in no acute distress. HEENT: Conjunctivae and lids unremarkable. Cardiovascular: Regular rhythm.  Lungs: Normal work of breathing. Neurologic: No focal deficits.   Lab Results  Component Value Date   CREATININE 1.03 02/14/2020   BUN 17 02/14/2020   NA 138 02/14/2020   K 3.8 02/14/2020   CL 101 02/14/2020   CO2 32 02/14/2020   Lab Results  Component Value Date   ALT 27 02/14/2020   AST 20 02/14/2020   ALKPHOS 114 02/14/2020   BILITOT 0.8 02/14/2020   Lab Results  Component Value Date   HGBA1C 6.1 02/14/2020   HGBA1C 5.6 05/01/2018   HGBA1C 5.3 01/26/2016   Lab Results  Component Value Date   INSULIN 33.0 (H) 03/12/2020   Lab Results  Component Value Date   TSH 0.17 (L) 02/20/2020   Lab Results  Component Value Date   CHOL 161 02/14/2020   HDL 51.10 02/14/2020   LDLCALC 87 02/14/2020   TRIG 113.0 02/14/2020   CHOLHDL 3 02/14/2020   Lab Results  Component Value Date   WBC 4.4 02/14/2020   HGB 14.7 02/14/2020   HCT 44.1 02/14/2020   MCV 84.0 02/14/2020   PLT 250.0 02/14/2020   Attestation Statements:   Reviewed by clinician on day of visit: allergies, medications, problem list, medical history, surgical history, family history, social history, and previous encounter notes.  I, 02/16/2020, CMA, am acting as Insurance claims handler for Energy manager, DO  I have reviewed the above documentation for accuracy and completeness, and I agree with the above. [employer], DO

## 2020-04-12 ENCOUNTER — Encounter (INDEPENDENT_AMBULATORY_CARE_PROVIDER_SITE_OTHER): Payer: Self-pay | Admitting: Bariatrics

## 2020-04-23 ENCOUNTER — Encounter (INDEPENDENT_AMBULATORY_CARE_PROVIDER_SITE_OTHER): Payer: Self-pay | Admitting: Bariatrics

## 2020-04-23 ENCOUNTER — Other Ambulatory Visit (INDEPENDENT_AMBULATORY_CARE_PROVIDER_SITE_OTHER): Payer: Self-pay | Admitting: Bariatrics

## 2020-04-23 ENCOUNTER — Other Ambulatory Visit: Payer: Self-pay

## 2020-04-23 ENCOUNTER — Ambulatory Visit (INDEPENDENT_AMBULATORY_CARE_PROVIDER_SITE_OTHER): Payer: BC Managed Care – PPO | Admitting: Bariatrics

## 2020-04-23 VITALS — BP 109/72 | HR 79 | Temp 98.2°F | Ht 66.0 in | Wt 333.0 lb

## 2020-04-23 DIAGNOSIS — R7303 Prediabetes: Secondary | ICD-10-CM

## 2020-04-23 DIAGNOSIS — Z6841 Body Mass Index (BMI) 40.0 and over, adult: Secondary | ICD-10-CM

## 2020-04-23 DIAGNOSIS — E559 Vitamin D deficiency, unspecified: Secondary | ICD-10-CM

## 2020-04-23 NOTE — Telephone Encounter (Signed)
Last seen by Dr. Brown. 

## 2020-04-24 NOTE — Progress Notes (Signed)
Chief Complaint:   OBESITY Lindsey Dunlap is here to discuss her progress with her obesity treatment plan along with follow-up of her obesity related diagnoses. Lindsey Dunlap is on the Category 4 Plan and states she is following her eating plan approximately 25-30% of the time. Lindsey Dunlap states she is doing 0 minutes 0 times per week.  Today's visit was #: 4 Starting weight: 342 lbs Starting date: 03/12/2020 Today's weight: 333 lbs Today's date: 04/23/2020 Total lbs lost to date: 9 Total lbs lost since last in-office visit: 3  Interim History: Lindsey Dunlap is down another 3 lbs since her last visit. She is concentrating on water and protein. She has been cooking more.  Subjective:   1. Pre-diabetes Lindsey Dunlap is currently taking metformin.  2. Vitamin D deficiency Lindsey Dunlap is currently taking high dose Vit D.  Assessment/Plan:   1. Pre-diabetes Lindsey Dunlap will continue her medications, and will continue to work on weight loss, exercise, and decreasing simple carbohydrates to help decrease the risk of diabetes.   2. Vitamin D deficiency Low Vitamin D level contributes to fatigue and are associated with obesity, breast, and colon cancer. Lindsey Dunlap agreed to continue taking prescription Vitamin D 50,000 IU every week and will follow-up for routine testing of Vitamin D, at least 2-3 times per year to avoid over-replacement.  3. Class 3 severe obesity due to excess calories with serious comorbidity and body mass index (BMI) of 50.0 to 59.9 in adult [facility]) Lindsey Dunlap is currently in the action stage of change. As such, her goal is to continue with weight loss efforts. She has agreed to the Category 4 Plan.   Lindsey Dunlap will be more adherent to the meal plan at least 80% of the time.  Exercise goals: Continue being active (walking).  Behavioral modification strategies: increasing lean protein intake, decreasing simple carbohydrates, increasing vegetables, increasing water intake, decreasing eating out, no skipping meals, meal planning and  cooking strategies, keeping healthy foods in the home, better snacking choices, emotional eating strategies and planning for success.  Lindsey Dunlap has agreed to follow-up with our clinic in 2 weeks. She was informed of the importance of frequent follow-up visits to maximize her success with intensive lifestyle modifications for her multiple health conditions.   Objective:   Blood pressure 109/72, pulse 79, temperature 98.2 F (36.8 C), height 5\' 6"  (1.676 m), weight (!) 333 lb (151 kg), SpO2 95 %. Body mass index is 53.75 kg/m.  General: Cooperative, alert, well developed, in no acute distress. HEENT: Conjunctivae and lids unremarkable. Cardiovascular: Regular rhythm.  Lungs: Normal work of breathing. Neurologic: No focal deficits.   Lab Results  Component Value Date   CREATININE 1.03 02/14/2020   BUN 17 02/14/2020   NA 138 02/14/2020   K 3.8 02/14/2020   CL 101 02/14/2020   CO2 32 02/14/2020   Lab Results  Component Value Date   ALT 27 02/14/2020   AST 20 02/14/2020   ALKPHOS 114 02/14/2020   BILITOT 0.8 02/14/2020   Lab Results  Component Value Date   HGBA1C 6.1 02/14/2020   HGBA1C 5.6 05/01/2018   HGBA1C 5.3 01/26/2016   Lab Results  Component Value Date   INSULIN 33.0 (H) 03/12/2020   Lab Results  Component Value Date   TSH 0.17 (L) 02/20/2020   Lab Results  Component Value Date   CHOL 161 02/14/2020   HDL 51.10 02/14/2020   LDLCALC 87 02/14/2020   TRIG 113.0 02/14/2020   CHOLHDL 3 02/14/2020   Lab Results  Component  Value Date   WBC 4.4 02/14/2020   HGB 14.7 02/14/2020   HCT 44.1 02/14/2020   MCV 84.0 02/14/2020   PLT 250.0 02/14/2020   No results found for: IRON, TIBC, FERRITIN  Attestation Statements:   Reviewed by clinician on day of visit: allergies, medications, problem list, medical history, surgical history, family history, social history, and previous encounter notes.  Time spent on visit including pre-visit chart review and post-visit care  and charting was 20 minutes.    Trude Mcburney, am acting as Energy manager for [employer], DO.  I have reviewed the above documentation for accuracy and completeness, and I agree with the above. Corinna Capra, DO

## 2020-04-28 ENCOUNTER — Other Ambulatory Visit: Payer: Self-pay

## 2020-04-28 ENCOUNTER — Encounter: Payer: Self-pay | Admitting: Pulmonary Disease

## 2020-04-28 ENCOUNTER — Encounter (INDEPENDENT_AMBULATORY_CARE_PROVIDER_SITE_OTHER): Payer: Self-pay | Admitting: Bariatrics

## 2020-04-28 ENCOUNTER — Ambulatory Visit: Payer: BC Managed Care – PPO | Admitting: Pulmonary Disease

## 2020-04-28 VITALS — BP 122/74 | HR 79 | Temp 97.9°F | Ht 66.0 in | Wt 338.0 lb

## 2020-04-28 DIAGNOSIS — R0683 Snoring: Secondary | ICD-10-CM

## 2020-04-28 NOTE — Progress Notes (Signed)
[facility]  Chief Complaint  Patient presents with  . Consult    Sleep consult    Constitutional:  BP 122/74 (BP Location: Left Wrist, Cuff Size: Normal)   Pulse 79   Temp 97.9 F (36.6 C) (Other (Comment)) Comment (Src): wrist  Ht 5\' 6"  (1.676 m)   Wt (!) 338 lb (153.3 kg)   SpO2 96% Comment: Room air  BMI 54.55 kg/m   Past Medical History:  Anxiety, Chicken pox, GERD, HTN, Allergies, Vit D deficiency, DM type 2  Past Surgical History:  She  has a past surgical history that includes Tonsillectomy (1991); Colonoscopy with propofol (N/A, 12/12/2018); and Ablation.  Brief Summary:  Lindsey Dunlap is a 55 y.o. female former smoker with snoring and fatigue.      Subjective:   She has trouble with daytime fatigue.  This has been going on for several years.  She snores and wakes up frequently at night.  She has trouble staying awake when watching TV or working on a computer.  Her mother has sleep apnea.  She goes to sleep at 10 pm.  She falls asleep in few minutes usually, but sometimes has trouble falling asleep.  She wakes up several times to use the bathroom.  She gets out of bed at 6 am.  She feels tired in the morning.  She denies morning headache.  She does not use anything to help her stay awake.  She takes tylenol PM at 730 pm to help with joint pains and to help sleep.  Her husband works out of town 4 days a week.  She feels her sleep is better when he is home.  She denies sleep walking, sleep talking, bruxism, or nightmares.  There is no history of restless legs.  She denies sleep hallucinations, sleep paralysis, or cataplexy.  The Epworth score is 14 out of 24.    Physical Exam:   Appearance - well kempt   ENMT - no sinus tenderness, no oral exudate, no LAN, Mallampati 4 airway, no stridor  Respiratory - equal breath sounds bilaterally, no wheezing or rales  CV - s1s2 regular rate and rhythm, no murmurs  Ext - no  clubbing, no edema  Skin - no rashes  Psych - normal mood and affect   Sleep Tests:     Social History:  She  reports that she quit smoking about 12 years ago. Her smoking use included cigarettes. She has a 22.00 pack-year smoking history. She has never used smokeless tobacco. She reports that she does not drink alcohol and does not use drugs.  Family History:  Her family history includes Arthritis/Rheumatoid in her sister; High blood pressure in her father; Hyperlipidemia in her mother; Hypertension in her maternal grandfather, maternal grandmother, and mother; Liver cancer in her maternal uncle; Sleep apnea in her mother.    Discussion:  She has snoring, sleep disruption, apnea, and daytime sleepiness.  She has history of hypertension, diabetes, and anxiety.  She has family history of sleep apnea.  Her BMI is > 35.  I am concerned she could have obstructive sleep apnea.  Assessment/Plan:   Snoring with excessive daytime sleepiness. - will need to arrange for a home sleep study  Osteoarthritis. - she has been using tylenol PM for pain relief and to help sleep - reassess whether she needs sleep aide medication after review of her sleep study  Obesity. - discussed how weight can impact sleep and risk for  sleep disordered breathing - discussed options to assist with weight loss: combination of diet modification, cardiovascular and strength training exercises  Cardiovascular risk. - had an extensive discussion regarding the adverse health consequences related to untreated sleep disordered breathing - specifically discussed the risks for hypertension, coronary artery disease, cardiac dysrhythmias, cerebrovascular disease, and diabetes - lifestyle modification discussed  Safe driving practices. - discussed how sleep disruption can increase risk of accidents, particularly when driving - safe driving practices were discussed  Therapies for obstructive sleep apnea. - if the sleep  study shows significant sleep apnea, then various therapies for treatment were reviewed: CPAP, oral appliance, and surgical interventions   Time Spent Involved in Patient Care on Day of Examination:  31 minutes  Follow up:  Patient Instructions  Will arrange for home sleep study Will call to arrange for follow up after sleep study reviewed    Medication List:   Allergies as of 04/28/2020   No Known Allergies     Medication List       Accurate as of April 28, 2020  9:50 AM. If you have any questions, ask your nurse or doctor.        FLUoxetine 40 MG capsule Commonly known as: PROZAC Take 1 capsule (40 mg total) by mouth daily. For anxiety.   hydrochlorothiazide 25 MG tablet Commonly known as: HYDRODIURIL Take 25 mg by mouth daily.   magnesium 30 MG tablet Take 1.25 mg by mouth 2 (two) times daily.   metFORMIN 500 MG tablet Commonly known as: GLUCOPHAGE Take 1 tablet (500 mg total) by mouth daily with breakfast.   Vitamin D (Ergocalciferol) 1.25 MG (50000 UNIT) Caps capsule Commonly known as: DRISDOL Take 1 capsule by mouth once weekly for 12 weeks.       Signature:  Coralyn Helling, MD [facility]/Critical Care Pager - [phone] 04/28/2020, 9:50 AM

## 2020-04-28 NOTE — Patient Instructions (Signed)
Will arrange for home sleep study Will call to arrange for follow up after sleep study reviewed  

## 2020-04-30 ENCOUNTER — Ambulatory Visit: Payer: Self-pay

## 2020-04-30 ENCOUNTER — Other Ambulatory Visit: Payer: Self-pay

## 2020-04-30 DIAGNOSIS — R0683 Snoring: Secondary | ICD-10-CM

## 2020-04-30 DIAGNOSIS — G4733 Obstructive sleep apnea (adult) (pediatric): Secondary | ICD-10-CM | POA: Diagnosis not present

## 2020-05-01 ENCOUNTER — Telehealth: Payer: Self-pay | Admitting: Pulmonary Disease

## 2020-05-01 DIAGNOSIS — G4733 Obstructive sleep apnea (adult) (pediatric): Secondary | ICD-10-CM | POA: Diagnosis not present

## 2020-05-01 NOTE — Telephone Encounter (Signed)
HST 04/30/20 >> AHI 31.9, SpO2 low 79%   Please inform her that her sleep study shows severe obstructive sleep apnea.  Please arrange for ROV with me or NP to discuss treatment options.

## 2020-05-01 NOTE — Telephone Encounter (Signed)
Called and went over HST results per Dr Craige Cotta with patient. All questions answered and patient expressed full understanding. Scheduled office visit with NP for Monday 05/05/2020 at 11am at the [place] office. Patient agreeable to time, date and location. Nothing further needed at this time.

## 2020-05-04 ENCOUNTER — Other Ambulatory Visit (INDEPENDENT_AMBULATORY_CARE_PROVIDER_SITE_OTHER): Payer: Self-pay | Admitting: Bariatrics

## 2020-05-04 DIAGNOSIS — R7303 Prediabetes: Secondary | ICD-10-CM

## 2020-05-05 ENCOUNTER — Other Ambulatory Visit: Payer: Self-pay

## 2020-05-05 ENCOUNTER — Ambulatory Visit: Payer: BC Managed Care – PPO | Admitting: Acute Care

## 2020-05-05 ENCOUNTER — Encounter: Payer: Self-pay | Admitting: Acute Care

## 2020-05-05 VITALS — BP 122/80 | HR 79 | Temp 97.9°F | Ht 66.0 in | Wt 338.4 lb

## 2020-05-05 DIAGNOSIS — M1990 Unspecified osteoarthritis, unspecified site: Secondary | ICD-10-CM

## 2020-05-05 DIAGNOSIS — G4733 Obstructive sleep apnea (adult) (pediatric): Secondary | ICD-10-CM

## 2020-05-05 NOTE — Progress Notes (Signed)
Reviewed and agree with assessment/plan.   Coralyn Helling, MD [facility] Pulmonary/Critical Care 05/05/2020, 5:50 PM Pager:  [phone]

## 2020-05-05 NOTE — Patient Instructions (Signed)
It is good to see you today. We will place an order for a CPAP machine. Auto Set 5-15 cm H2O. Nasal pillows We will order all equipment and supplies. Enrol in [place]. Wear CPAP at bedtime.  Goal is to wear for at least 6 hours each night for maximal clinical benefit. Continue to work on weight loss, as the link between excess weight  and sleep apnea is well established.   Remember to establish a good bedtime routine, and work on sleep hygiene.  Limit daytime naps , avoid stimulants such as caffeine and nicotine close to bedtime, exercise daily to promote sleep quality, avoid heavy , spicy, fried , or rich foods before bed. Ensure adequate exposure to natural light during the day,establish a relaxing bedtime routine with a pleasant sleep environment ( Bedroom between 60 and 67 degrees, turn off bright lights , TV or device screens  , consider black out curtains or white noise machines) Do not drive if sleepy. Remember to clean mask, tubing, filter, and reservoir once weekly with soapy water.  Follow up with Dr. Craige Cotta or Maralyn Sago NP  In 8 weeks  or before as needed.   Continue with [facility].  Please contact office for sooner follow up if symptoms do not improve or worsen or seek emergency care

## 2020-05-05 NOTE — Progress Notes (Signed)
History of Present Illness Lindsey Dunlap is a 55 y.o. female former smoker seen for consult by Dr. Craige Cotta for snoring and fatigue.    05/05/2020  Pt. Presents today to review her HST results. HST was done 04/30/2020. It reveals severe OSA, with desaturations tp 79%. There are both obstructive and central apneas. We discussed the risks of untreated OSA. She is in agreement with CPAP therapy. She has issues with claustrophobia. She is willing to try a nasal pillow , and assess AHI after a month worth of treatment. .  Test Results: HST 04/30/20 >> AHI 31.9, SpO2 low 79% The Epworth score is 14 out of 24.  CBC Latest Ref Rng & Units 02/14/2020  WBC 4.0 - 10.5 K/uL 4.4  Hemoglobin 12.0 - 15.0 g/dL 17.7  Hematocrit 11.6 - 46.0 % 44.1  Platelets 150.0 - 400.0 K/uL 250.0    BMP Latest Ref Rng & Units 02/14/2020 04/06/2019 06/15/2018  Glucose 70 - 99 mg/dL 579(U) 383(F) 383(A)  BUN 6 - 23 mg/dL 17 18 15   Creatinine 0.40 - 1.20 mg/dL 9.19 1.66  Sodium 135 - 145 mEq/L 138 139 137  Potassium 3.5 - 5.1 mEq/L 3.8 3.5 3.7  Chloride 96 - 112 mEq/L 101 101 99  CO2 19 - 32 mEq/L 32 30 29  Calcium 8.4 - 10.5 mg/dL 9.7 9.5 9.3    BNP No results found for: BNP  ProBNP No results found for: PROBNP  PFT No results found for: FEV1PRE, FEV1POST, FVCPRE, FVCPOST, TLC, DLCOUNC, PREFEV1FVCRT, PSTFEV1FVCRT  No results found.   Past medical hx Past Medical History:  Diagnosis Date  . Anxiety   . Chickenpox   . GERD (gastroesophageal reflux disease)   . High blood pressure   . Joint pain   . Knee pain   . Night sweats   . Seasonal allergies   . SOB (shortness of breath)   . Vitamin D deficiency      Social History   Tobacco Use  . Smoking status: Former Smoker    Packs/day: 1.00    Years: 22.00    Pack years: 22.00    Types: Cigarettes    Quit date: 04/25/2008    Years since quitting: 12.0  . Smokeless tobacco: Never Used  Vaping Use  . Vaping Use: Never used  Substance Use  Topics  . Alcohol use: No  . Drug use: Never    Lindsey Dunlap reports that she quit smoking about 12 years ago. Her smoking use included cigarettes. She has a 22.00 pack-year smoking history. She has never used smokeless tobacco. She reports that she does not drink alcohol and does not use drugs.  Tobacco Cessation: Former smoker , Quit 2010 with a 22 pack year smoking history  Past surgical hx, Family hx, Social hx all reviewed.  Current Outpatient Medications on File Prior to Visit  Medication Sig  . FLUoxetine (PROZAC) 40 MG capsule Take 1 capsule (40 mg total) by mouth daily. For anxiety.  . hydrochlorothiazide (HYDRODIURIL) 25 MG tablet Take 25 mg by mouth daily.  . magnesium 30 MG tablet Take 1.25 mg by mouth 2 (two) times daily.  . metFORMIN (GLUCOPHAGE) 500 MG tablet Take 1 tablet (500 mg total) by mouth daily with breakfast.  . Vitamin D, Ergocalciferol, (DRISDOL) 1.25 MG (50000 UNIT) CAPS capsule Take 1 capsule by mouth once weekly for 12 weeks.   No current facility-administered medications on file prior to visit.     No Known Allergies  Review  Of Systems:  Constitutional:   No  weight loss, night sweats,  Fevers, chills, fatigue, or  lassitude.  HEENT:   No headaches,  Difficulty swallowing,  Tooth/dental problems, or  Sore throat,                No sneezing, itching, ear ache, nasal congestion, post nasal drip,   CV:  No chest pain,  Orthopnea, PND, swelling in lower extremities, anasarca, dizziness, palpitations, syncope.   GI  No heartburn, indigestion, abdominal pain, nausea, vomiting, diarrhea, change in bowel habits, loss of appetite, bloody stools.   Resp: + shortness of breath with exertion none  at rest.  No excess mucus, no productive cough,  No non-productive cough,  No coughing up of blood.  No change in color of mucus.  No wheezing.  No chest wall deformity  Skin: no rash or lesions.  GU: no dysuria, change in color of urine, no urgency or frequency.  No  flank pain, no hematuria   MS:  No joint pain or swelling.  No decreased range of motion.  No back pain.  Psych:  No change in mood or affect. No depression or anxiety.  No memory loss.   Vital Signs BP 122/80 (BP Location: Left Arm, Cuff Size: Normal)   Pulse 79   Temp 97.9 F (36.6 C) (Oral)   Ht 5\' 6"  (1.676 m)   Wt (!) 338 lb 6.4 oz (153.5 kg)   SpO2 93%   BMI 54.62 kg/m    Physical Exam:  General- No distress,  A&Ox3, appropriate ENT: No sinus tenderness, TM clear, pale nasal mucosa, no oral exudate,no post nasal drip, no LAN Cardiac: S1, S2, regular rate and rhythm, no murmur Chest: No wheeze/ rales/ dullness; no accessory muscle use, no nasal flaring, no sternal retractions Abd.: Soft Non-tender, ND, BS +, Body mass index is 54.62 kg/m. Ext: No clubbing cyanosis, edema Neuro:  normal strength, MAE x 4, A&O x 3 Skin: No rashes, warm and dry, no lesions Psych: normal mood and behavior   Assessment/Plan Severe OSA AHI of 31.9, SpO2 low 79% Mixed obstructive and Central Apneas Plan We will place an order for a CPAP machine. Auto Set 5-15 cm H2O. Nasal pillows We will order all equipment and supplies. Enrol in [place]. Wear CPAP at bedtime.  Goal is to wear for at least 6 hours each night for maximal clinical benefit. Continue to work on weight loss, as the link between excess weight  and sleep apnea is well established.   Remember to establish a good bedtime routine, and work on sleep hygiene.  Limit daytime naps , avoid stimulants such as caffeine and nicotine close to bedtime, exercise daily to promote sleep quality, avoid heavy , spicy, fried , or rich foods before bed. Ensure adequate exposure to natural light during the day,establish a relaxing bedtime routine with a pleasant sleep environment ( Bedroom between 60 and 67 degrees, turn off bright lights , TV or device screens  , consider black out curtains or white noise machines) Do not drive if  sleepy. Remember to clean mask, tubing, filter, and reservoir once weekly with soapy water.  Follow up with Dr. [place] or Craige Cotta NP  In 8 weeks  or before as needed.   Continue with [facility].  Please contact office for sooner follow up if symptoms do not improve or worsen or seek emergency care  Osteoarthritis Plan Would not start sleep aids until OSA is controlled on CPAP.  Continue Tylenol PM  This appointment was 30 min long with over 50% of the time in direct face-to-face patient care, assessment, plan of care, and follow-up.     Bevelyn Ngo, NP 05/05/2020  11:33 AM

## 2020-05-06 NOTE — Telephone Encounter (Signed)
Last seen by Dr. Brown. 

## 2020-05-08 ENCOUNTER — Ambulatory Visit (INDEPENDENT_AMBULATORY_CARE_PROVIDER_SITE_OTHER): Payer: BC Managed Care – PPO | Admitting: Bariatrics

## 2020-05-08 ENCOUNTER — Encounter (INDEPENDENT_AMBULATORY_CARE_PROVIDER_SITE_OTHER): Payer: Self-pay | Admitting: Bariatrics

## 2020-05-08 ENCOUNTER — Other Ambulatory Visit (INDEPENDENT_AMBULATORY_CARE_PROVIDER_SITE_OTHER): Payer: Self-pay | Admitting: Bariatrics

## 2020-05-08 ENCOUNTER — Other Ambulatory Visit: Payer: Self-pay

## 2020-05-08 VITALS — BP 125/80 | HR 85 | Temp 97.8°F | Ht 66.0 in | Wt 335.0 lb

## 2020-05-08 DIAGNOSIS — Z6841 Body Mass Index (BMI) 40.0 and over, adult: Secondary | ICD-10-CM | POA: Diagnosis not present

## 2020-05-08 DIAGNOSIS — R7303 Prediabetes: Secondary | ICD-10-CM | POA: Diagnosis not present

## 2020-05-08 DIAGNOSIS — Z9189 Other specified personal risk factors, not elsewhere classified: Secondary | ICD-10-CM

## 2020-05-08 DIAGNOSIS — G4730 Sleep apnea, unspecified: Secondary | ICD-10-CM | POA: Diagnosis not present

## 2020-05-08 DIAGNOSIS — E662 Morbid (severe) obesity with alveolar hypoventilation: Secondary | ICD-10-CM | POA: Diagnosis not present

## 2020-05-08 MED ORDER — METFORMIN HCL 500 MG PO TABS: 500.0000 mg | ORAL_TABLET | Freq: Two times a day (BID) | ORAL | 0 refills | Status: DC

## 2020-05-13 NOTE — Progress Notes (Signed)
Chief Complaint:   OBESITY Lindsey Dunlap is here to discuss her progress with her obesity treatment plan along with follow-up of her obesity related diagnoses. Lindsey Dunlap is on the Category 4 Plan and states she is following her eating plan approximately 40% of the time. 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  Today's visit was #: 5 Starting weight: 342 lbs Starting date: 03/12/2020 Today's weight: 335 lbs Today's date: 05/08/2020 Total lbs lost to date: 7 lbs Total lbs lost since last in-office visit: 0  Interim History: Lindsey Dunlap is up 2 lbs since her last visit. She stated that she had not "been good."  Subjective:   1. Pre-diabetes Lindsey Dunlap has a diagnosis of prediabetes based on her elevated HgA1c and was informed this puts her at greater risk of developing diabetes. She continues to work on diet and exercise to decrease her risk of diabetes. She denies nausea or hypoglycemia.  Lab Results  Component Value Date   HGBA1C 6.1 02/14/2020   Lab Results  Component Value Date   INSULIN 33.0 (H) 03/12/2020    2. Sleep apnea, unspecified type Central and obstructive sleep apnea. Lindsey Dunlap has a diagnosis of sleep apnea. She reports that she is not using a CPAP regularly.   3. At risk for activity intolerance Lindsey Dunlap is at risk for exercise intolerance due to obesity and sleep apnea.  Assessment/Plan:   1. Pre-diabetes Lindsey Dunlap will continue to work on weight loss, exercise, and decreasing simple carbohydrates to help decrease the risk of diabetes.   - metFORMIN (GLUCOPHAGE) 500 MG tablet; Take 1 tablet (500 mg total) by mouth 2 (two) times daily with a meal.  Dispense: 60 tablet; Refill: 0  2. Sleep apnea, unspecified type Intensive lifestyle modifications are the first line treatment for this issue. We discussed several lifestyle modifications today and she will continue to work on diet, exercise and weight loss efforts. We will continue to monitor. Orders and follow up as  documented in patient record. Pt will have CPAP next week.  3. At risk for activity intolerance Lindsey Dunlap was given approximately 15 minutes of exercise intolerance counseling today. She is 55 y.o. female and has risk factors exercise intolerance including obesity. We discussed intensive lifestyle modifications today with an emphasis on specific weight loss instructions and strategies. Lindsey Dunlap will slowly increase activity as tolerated.  Repetitive spaced learning was employed today to elicit superior memory formation and behavioral change.  4. Class 3 obesity with alveolar hypoventilation, serious comorbidity, and body mass index (BMI) of 50.0 to 59.9 in adult Lindsey Dunlap) Lindsey Dunlap is currently in the action stage of change. As such, her goal is to continue with weight loss efforts. She has agreed to the Category 4 Plan.   Pt will be adherent to plan.  Exercise goals: As is.  Behavioral modification strategies: increasing lean protein intake, decreasing simple carbohydrates, increasing vegetables, increasing water intake, decreasing eating out, no skipping meals, meal planning and cooking strategies, keeping healthy foods in the home, ways to avoid boredom eating and planning for success.  Lindsey Dunlap has agreed to follow-up with our clinic in 2 weeks. She was informed of the importance of frequent follow-up visits to maximize her success with intensive lifestyle modifications for her multiple health conditions.   Objective:   Blood pressure 125/80, pulse 85, temperature 97.8 F (36.6 C), height 5\' 6"  (1.676 m), weight (!) 335 lb (152 kg), SpO2 96 %. Body mass index is 54.07 kg/m.  General: Cooperative, alert, well  developed, in no acute distress. HEENT: Conjunctivae and lids unremarkable. Cardiovascular: Regular rhythm.  Lungs: Normal work of breathing. Neurologic: No focal deficits.   Lab Results  Component Value Date   CREATININE 1.03 02/14/2020   BUN 17 02/14/2020   NA 138 02/14/2020   K 3.8  02/14/2020   CL 101 02/14/2020   CO2 32 02/14/2020   Lab Results  Component Value Date   ALT 27 02/14/2020   AST 20 02/14/2020   ALKPHOS 114 02/14/2020   BILITOT 0.8 02/14/2020   Lab Results  Component Value Date   HGBA1C 6.1 02/14/2020   HGBA1C 5.6 05/01/2018   HGBA1C 5.3 01/26/2016   Lab Results  Component Value Date   INSULIN 33.0 (H) 03/12/2020   Lab Results  Component Value Date   TSH 0.17 (L) 02/20/2020   Lab Results  Component Value Date   CHOL 161 02/14/2020   HDL 51.10 02/14/2020   LDLCALC 87 02/14/2020   TRIG 113.0 02/14/2020   CHOLHDL 3 02/14/2020   Lab Results  Component Value Date   WBC 4.4 02/14/2020   HGB 14.7 02/14/2020   HCT 44.1 02/14/2020   MCV 84.0 02/14/2020   PLT 250.0 02/14/2020     Attestation Statements:   Reviewed by clinician on day of visit: allergies, medications, problem list, medical history, surgical history, family history, social history, and previous encounter notes.  Edmund Hilda, am acting as Energy manager for [employer], DO.  I have reviewed the above documentation for accuracy and completeness, and I agree with the above. Corinna Capra, DO

## 2020-05-22 ENCOUNTER — Ambulatory Visit (INDEPENDENT_AMBULATORY_CARE_PROVIDER_SITE_OTHER): Payer: BC Managed Care – PPO | Admitting: Bariatrics

## 2020-05-22 ENCOUNTER — Other Ambulatory Visit: Payer: Self-pay

## 2020-05-22 ENCOUNTER — Encounter (INDEPENDENT_AMBULATORY_CARE_PROVIDER_SITE_OTHER): Payer: Self-pay | Admitting: Bariatrics

## 2020-05-22 VITALS — BP 130/82 | HR 66 | Temp 97.8°F | Ht 66.0 in | Wt 334.0 lb

## 2020-05-22 DIAGNOSIS — I10 Essential (primary) hypertension: Secondary | ICD-10-CM

## 2020-05-22 DIAGNOSIS — Z6841 Body Mass Index (BMI) 40.0 and over, adult: Secondary | ICD-10-CM | POA: Diagnosis not present

## 2020-05-22 DIAGNOSIS — E559 Vitamin D deficiency, unspecified: Secondary | ICD-10-CM | POA: Diagnosis not present

## 2020-05-22 DIAGNOSIS — E662 Morbid (severe) obesity with alveolar hypoventilation: Secondary | ICD-10-CM | POA: Diagnosis not present

## 2020-05-27 ENCOUNTER — Encounter (INDEPENDENT_AMBULATORY_CARE_PROVIDER_SITE_OTHER): Payer: Self-pay | Admitting: Bariatrics

## 2020-05-27 DIAGNOSIS — G4733 Obstructive sleep apnea (adult) (pediatric): Secondary | ICD-10-CM | POA: Diagnosis not present

## 2020-05-27 NOTE — Progress Notes (Signed)
Chief Complaint:   OBESITY Lindsey Dunlap is here to discuss her progress with her obesity treatment plan along with follow-up of her obesity related diagnoses. Lindsey Dunlap is on the Category 4 Plan and states she is following her eating plan approximately 0% of the time. Lindsey Dunlap states she is not exercising regularly.  Today's visit was #: 6 Starting weight: 342 lbs Starting date: 03/12/2020 Today's weight: 334 lbs Today's date: 05/22/2020 Total lbs lost to date: 8 lbs Total lbs lost since last in-office visit: 1 lb  Interim History: Lindsey Dunlap is down 1 pound.  She is working on her CPAP machine.  She is stressed today.  She is not as hungry.  She is having some chocolate cravings.  Subjective:   1. Essential hypertension Review: taking medications as instructed, no medication side effects noted, no chest pain on exertion, no dyspnea on exertion, no swelling of ankles.  She is taking HCTZ.  BP Readings from Last 3 Encounters:  05/22/20 130/82  05/08/20 125/80  05/05/20 122/80   2. Vitamin D deficiency Lindsey Dunlap's Vitamin D level was 19.14 on 02/20/2020. She is currently taking prescription vitamin D 50,000 IU each week. She denies nausea, vomiting or muscle weakness.  Assessment/Plan:   1. Essential hypertension Lindsey Dunlap is working on healthy weight loss and exercise to improve blood pressure control. We will watch for signs of hypotension as she continues her lifestyle modifications.  Continue medication.  Continue to work on diet and weight loss.  2. Vitamin D deficiency Low Vitamin D level contributes to fatigue and are associated with obesity, breast, and colon cancer. She agrees to continue to take prescription Vitamin D @50 ,000 IU every week and will follow-up for routine testing of Vitamin D, at least 2-3 times per year to avoid over-replacement.  3. Obesity, current BMI 53.93  Lindsey Dunlap is currently in the action stage of change. As such, her goal is to continue with weight loss efforts. She Dunlap agreed  to the Category 4 Plan.   She will work on meal planning and intentional eating.  Exercise goals: She will start to walk at 10 minute increments and increase.  Behavioral modification strategies: increasing lean protein intake, decreasing simple carbohydrates, increasing vegetables, increasing water intake, decreasing eating out, no skipping meals, meal planning and cooking strategies, keeping healthy foods in the home, better snacking choices, emotional eating strategies and planning for success.  Lindsey Dunlap agreed to follow-up with our clinic in 2 weeks. She was informed of the importance of frequent follow-up visits to maximize her success with intensive lifestyle modifications for her multiple health conditions.   Objective:   Blood pressure 130/82, pulse 66, temperature 97.8 F (36.6 C), height 5\' 6"  (1.676 m), weight (!) 334 lb (151.5 kg), SpO2 97 %. Body mass index is 53.91 kg/m.  General: Cooperative, alert, well developed, in no acute distress. HEENT: Conjunctivae and lids unremarkable. Cardiovascular: Regular rhythm.  Lungs: Normal work of breathing. Neurologic: No focal deficits.   Lab Results  Component Value Date   CREATININE 1.03 02/14/2020   BUN 17 02/14/2020   NA 138 02/14/2020   K 3.8 02/14/2020   CL 101 02/14/2020   CO2 32 02/14/2020   Lab Results  Component Value Date   ALT 27 02/14/2020   AST 20 02/14/2020   ALKPHOS 114 02/14/2020   BILITOT 0.8 02/14/2020   Lab Results  Component Value Date   HGBA1C 6.1 02/14/2020   HGBA1C 5.6 05/01/2018   HGBA1C 5.3 01/26/2016   Lab  Results  Component Value Date   INSULIN 33.0 (H) 03/12/2020   Lab Results  Component Value Date   TSH 0.17 (L) 02/20/2020   Lab Results  Component Value Date   CHOL 161 02/14/2020   HDL 51.10 02/14/2020   LDLCALC 87 02/14/2020   TRIG 113.0 02/14/2020   CHOLHDL 3 02/14/2020   Lab Results  Component Value Date   WBC 4.4 02/14/2020   HGB 14.7 02/14/2020   HCT 44.1 02/14/2020    MCV 84.0 02/14/2020   PLT 250.0 02/14/2020   Attestation Statements:   Reviewed by clinician on day of visit: allergies, medications, problem list, medical history, surgical history, family history, social history, and previous encounter notes.  Time spent on visit including pre-visit chart review and post-visit care and charting was 20 minutes.   I, Insurance claims handler, CMA, am acting as Energy manager for [employer], DO  I have reviewed the above documentation for accuracy and completeness, and I agree with the above. Lindsey Capra, DO

## 2020-05-29 NOTE — Telephone Encounter (Signed)
Dr Craige Cotta,   We received this message on mychart from patient:  I started my CPAP for a few hours Tuesday and used it 7 hours last night.  I noticed in the middle of the night and continuing today pain in my ears.  Is there anything I can do to help that?  I will be bringing you a download tomorrow 05/30/2020 to review.  Please advise.

## 2020-06-09 ENCOUNTER — Other Ambulatory Visit: Payer: Self-pay

## 2020-06-09 ENCOUNTER — Ambulatory Visit (INDEPENDENT_AMBULATORY_CARE_PROVIDER_SITE_OTHER): Payer: BC Managed Care – PPO | Admitting: Bariatrics

## 2020-06-09 ENCOUNTER — Encounter (INDEPENDENT_AMBULATORY_CARE_PROVIDER_SITE_OTHER): Payer: Self-pay | Admitting: Bariatrics

## 2020-06-09 VITALS — BP 130/80 | HR 71 | Temp 98.0°F | Ht 66.0 in | Wt 333.0 lb

## 2020-06-09 DIAGNOSIS — I10 Essential (primary) hypertension: Secondary | ICD-10-CM

## 2020-06-09 DIAGNOSIS — R7303 Prediabetes: Secondary | ICD-10-CM | POA: Diagnosis not present

## 2020-06-09 DIAGNOSIS — G4733 Obstructive sleep apnea (adult) (pediatric): Secondary | ICD-10-CM | POA: Diagnosis not present

## 2020-06-09 DIAGNOSIS — Z6841 Body Mass Index (BMI) 40.0 and over, adult: Secondary | ICD-10-CM

## 2020-06-12 ENCOUNTER — Encounter (INDEPENDENT_AMBULATORY_CARE_PROVIDER_SITE_OTHER): Payer: Self-pay | Admitting: Bariatrics

## 2020-06-12 NOTE — Progress Notes (Signed)
Chief Complaint:   OBESITY Lindsey Dunlap is here to discuss her progress with her obesity treatment plan along with follow-up of her obesity related diagnoses. Lindsey Dunlap is on the Category 4 Plan and states she is following her eating plan approximately 25% of the time. Lindsey Dunlap states she is walking for 10-15 minutes 3-4 times per week.  Today's visit was #: 7 Starting weight: 342 lbs Starting date: 03/12/2020 Today's weight: 333 lbs Today's date: 06/09/2020 Total lbs lost to date: 9 lbs Total lbs lost since last in-office visit: 1 lb  Interim History: Lindsey Dunlap is down an additional pound since her last visit.  She got her CPAP machine.  She is doing well with her water and protein.   Subjective:   1. Prediabetes Lindsey Dunlap has a diagnosis of prediabetes based on her elevated HgA1c and was informed this puts her at greater risk of developing diabetes. She continues to work on diet and exercise to decrease her risk of diabetes. She denies nausea or hypoglycemia.  She is taking metformin.  Lab Results  Component Value Date   HGBA1C 6.1 02/14/2020   Lab Results  Component Value Date   INSULIN 33.0 (H) 03/12/2020   2. Essential hypertension Controlled.  Review: taking medications as instructed, no medication side effects noted, no chest pain on exertion, no dyspnea on exertion, no swelling of ankles.  Taking HCTZ.  BP Readings from Last 3 Encounters:  06/09/20 130/80  05/22/20 130/82  05/08/20 125/80   3. OSA (obstructive sleep apnea) Lindsey Dunlap has a diagnosis of sleep apnea. She just got her CPAP machine.  She says her energy has increased.   Assessment/Plan:   1. Prediabetes Lindsey Dunlap will continue to work on weight loss, exercise, and decreasing simple carbohydrates to help decrease the risk of diabetes.  Continue metformin.  2. Essential hypertension Lindsey Dunlap is working on healthy weight loss and exercise to improve blood pressure control. We will watch for signs of hypotension as she continues her  lifestyle modifications.  Continue medications.   3. OSA (obstructive sleep apnea) Intensive lifestyle modifications are the first line treatment for this issue. We discussed several lifestyle modifications today and she will continue to work on diet, exercise and weight loss efforts. We will continue to monitor. Orders and follow up as documented in patient record.  Will use her CPAP nightly.   4. Obesity, current BMI 49  Lindsey Dunlap is currently in the action stage of change. As such, her goal is to continue with weight loss efforts. She has agreed to the Category 4 Plan.   She will work on meal planning and mindful eating.  Exercise goals: Walking and will increase over time.  Behavioral modification strategies: increasing lean protein intake, decreasing simple carbohydrates, increasing vegetables, increasing water intake, decreasing eating out, no skipping meals, meal planning and cooking strategies, keeping healthy foods in the home and planning for success.  Lindsey Dunlap has agreed to follow-up with our clinic in 2 weeks, fasting. She was informed of the importance of frequent follow-up visits to maximize her success with intensive lifestyle modifications for her multiple health conditions.   Objective:   Blood pressure 130/80, pulse 71, temperature 98 F (36.7 C), height 5\' 6"  (1.676 m), weight (!) 333 lb (151 kg), SpO2 95 %. Body mass index is 53.75 kg/m.  General: Cooperative, alert, well developed, in no acute distress. HEENT: Conjunctivae and lids unremarkable. Cardiovascular: Regular rhythm.  Lungs: Normal work of breathing. Neurologic: No focal deficits.   Lab Results  Component Value Date   CREATININE 1.03 02/14/2020   BUN 17 02/14/2020   NA 138 02/14/2020   K 3.8 02/14/2020   CL 101 02/14/2020   CO2 32 02/14/2020   Lab Results  Component Value Date   ALT 27 02/14/2020   AST 20 02/14/2020   ALKPHOS 114 02/14/2020   BILITOT 0.8 02/14/2020   Lab Results  Component Value  Date   HGBA1C 6.1 02/14/2020   HGBA1C 5.6 05/01/2018   HGBA1C 5.3 01/26/2016   Lab Results  Component Value Date   INSULIN 33.0 (H) 03/12/2020   Lab Results  Component Value Date   TSH 0.17 (L) 02/20/2020   Lab Results  Component Value Date   CHOL 161 02/14/2020   HDL 51.10 02/14/2020   LDLCALC 87 02/14/2020   TRIG 113.0 02/14/2020   CHOLHDL 3 02/14/2020   Lab Results  Component Value Date   WBC 4.4 02/14/2020   HGB 14.7 02/14/2020   HCT 44.1 02/14/2020   MCV 84.0 02/14/2020   PLT 250.0 02/14/2020   Attestation Statements:   Reviewed by clinician on day of visit: allergies, medications, problem list, medical history, surgical history, family history, social history, and previous encounter notes.  Time spent on visit including pre-visit chart review and post-visit care and charting was 20 minutes.   I, Insurance claims handler, CMA, am acting as Energy manager for [employer], DO  I have reviewed the above documentation for accuracy and completeness, and I agree with the above. Corinna Capra, DO

## 2020-06-25 ENCOUNTER — Other Ambulatory Visit (INDEPENDENT_AMBULATORY_CARE_PROVIDER_SITE_OTHER): Payer: Self-pay | Admitting: Bariatrics

## 2020-06-25 DIAGNOSIS — R7303 Prediabetes: Secondary | ICD-10-CM

## 2020-06-25 NOTE — Telephone Encounter (Signed)
Refill request

## 2020-06-25 NOTE — Telephone Encounter (Signed)
Dr.Brown 

## 2020-06-27 DIAGNOSIS — G4733 Obstructive sleep apnea (adult) (pediatric): Secondary | ICD-10-CM | POA: Diagnosis not present

## 2020-07-02 ENCOUNTER — Encounter: Payer: Self-pay | Admitting: Pulmonary Disease

## 2020-07-02 ENCOUNTER — Other Ambulatory Visit: Payer: Self-pay

## 2020-07-02 ENCOUNTER — Ambulatory Visit: Payer: BC Managed Care – PPO | Admitting: Pulmonary Disease

## 2020-07-02 VITALS — BP 126/82 | HR 65 | Temp 97.9°F | Ht 66.0 in | Wt 335.0 lb

## 2020-07-02 DIAGNOSIS — E669 Obesity, unspecified: Secondary | ICD-10-CM

## 2020-07-02 DIAGNOSIS — G4733 Obstructive sleep apnea (adult) (pediatric): Secondary | ICD-10-CM | POA: Diagnosis not present

## 2020-07-02 DIAGNOSIS — G4730 Sleep apnea, unspecified: Secondary | ICD-10-CM

## 2020-07-02 NOTE — Patient Instructions (Signed)
Follow up in 1 year.

## 2020-07-02 NOTE — Progress Notes (Signed)
[name]  Chief Complaint  Patient presents with  . Follow-up    F/U after using CPAP. States she is still using used to machine. She is currently trying out 2 masks. States the pressure seems to be ok when she is not using the nasal pillows. Nasal pillows caused her ears to hurt.     Constitutional:  BP 126/82   Pulse 65   Temp 97.9 F (36.6 C) (Temporal)   Ht 5\' 6"  (1.676 m)   Wt (!) 335 lb (152 kg)   SpO2 98% Comment: on RA  BMI 54.07 kg/m   Past Medical History:  Anxiety, Chicken pox, GERD, HTN, Allergies, Vit D deficiency, DM type 2  Past Surgical History:  She  has a past surgical history that includes Tonsillectomy (1991); Colonoscopy with propofol (N/A, 12/12/2018); and Ablation.  Brief Summary:  Lindsey Dunlap is a 55 y.o. female former smoker with obstructive sleep apnea.      Subjective:   Home sleep study showed severe sleep apnea.  Started on auto CPAP.  Nasal mask caused congestion in her ear.  Switched to full face mask and working better.  More rested, and more energy.  Not feeling short of breath and not having as much trouble with anxiety.  Enrolled in healthy weight management program.   Physical Exam:   Appearance - well kempt   ENMT - no sinus tenderness, no oral exudate, no LAN, Mallampati 4 airway, no stridor  Respiratory - equal breath sounds bilaterally, no wheezing or rales  CV - s1s2 regular rate and rhythm, no murmurs  Ext - no clubbing, no edema  Skin - no rashes  Psych - normal mood and affect   Sleep Tests:   HST 04/30/20 >> AHI 31.9, SpO2 low 79%  Auto CPAP 06/01/20 to 06/30/20 >> used on 29 of 30 nights with average 7 hrs 19 min.  Average AHI 1.6 with median CPAP 12 and 95 th percentile CPAP 14 cm H2O  Social History:  She  reports that she quit smoking about 12 years ago. Her smoking use included cigarettes. She has a 22.00 pack-year smoking history. She has never used smokeless  tobacco. She reports that she does not drink alcohol and does not use drugs.  Family History:  Her family history includes Arthritis/Rheumatoid in her sister; High blood pressure in her father; Hyperlipidemia in her mother; Hypertension in her maternal grandfather, maternal grandmother, and mother; Liver cancer in her maternal uncle; Sleep apnea in her mother.     Assessment/Plan:   Obstructive sleep apnea. - she is compliant with CPAP and reports benefit from therapy - uses [employer] for her DME - continue auto CPAP 5 to 15 cm H2O  Obesity. - discussed how weight can impact sleep and risk for sleep disordered breathing - discussed options to assist with weight loss: combination of diet modification, cardiovascular and strength training exercises   Time Spent Involved in Patient Care on Day of Examination:  22 minutes  Follow up:  Patient Instructions  Follow up in 1 year   Medication List:   Allergies as of 07/02/2020   No Known Allergies     Medication List       Accurate as of Jul 02, 2020 10:35 AM. If you have any questions, ask your nurse or doctor.        FLUoxetine 40 MG capsule Commonly known as: PROZAC Take 1 capsule (40 mg total) by mouth daily.  For anxiety.   hydrochlorothiazide 25 MG tablet Commonly known as: HYDRODIURIL Take 25 mg by mouth daily.   magnesium 30 MG tablet Take 1.25 mg by mouth 2 (two) times daily.   metFORMIN 500 MG tablet Commonly known as: GLUCOPHAGE TAKE 1 TABLET BY MOUTH TWICE DAILY WITH A MEAL       Signature:  Coralyn Helling, MD [facility] Pager - [phone] 07/02/2020, 10:35 AM

## 2020-07-03 DIAGNOSIS — M25522 Pain in left elbow: Secondary | ICD-10-CM | POA: Diagnosis not present

## 2020-07-07 ENCOUNTER — Encounter (INDEPENDENT_AMBULATORY_CARE_PROVIDER_SITE_OTHER): Payer: Self-pay | Admitting: Bariatrics

## 2020-07-07 ENCOUNTER — Other Ambulatory Visit: Payer: Self-pay

## 2020-07-07 ENCOUNTER — Ambulatory Visit (INDEPENDENT_AMBULATORY_CARE_PROVIDER_SITE_OTHER): Payer: BC Managed Care – PPO | Admitting: Bariatrics

## 2020-07-07 VITALS — BP 101/58 | HR 69 | Temp 98.0°F | Ht 66.0 in | Wt 336.0 lb

## 2020-07-07 DIAGNOSIS — Z9189 Other specified personal risk factors, not elsewhere classified: Secondary | ICD-10-CM

## 2020-07-07 DIAGNOSIS — R7303 Prediabetes: Secondary | ICD-10-CM | POA: Diagnosis not present

## 2020-07-07 DIAGNOSIS — E559 Vitamin D deficiency, unspecified: Secondary | ICD-10-CM

## 2020-07-07 DIAGNOSIS — Z6841 Body Mass Index (BMI) 40.0 and over, adult: Secondary | ICD-10-CM

## 2020-07-07 DIAGNOSIS — I10 Essential (primary) hypertension: Secondary | ICD-10-CM | POA: Diagnosis not present

## 2020-07-08 ENCOUNTER — Encounter (INDEPENDENT_AMBULATORY_CARE_PROVIDER_SITE_OTHER): Payer: Self-pay | Admitting: Bariatrics

## 2020-07-08 LAB — COMPREHENSIVE METABOLIC PANEL
ALT: 29 IU/L (ref 0–32)
AST: 24 IU/L (ref 0–40)
Albumin/Globulin Ratio: 1.4 (ref 1.2–2.2)
Albumin: 4.2 g/dL (ref 3.8–4.9)
Alkaline Phosphatase: 115 IU/L (ref 44–121)
BUN/Creatinine Ratio: 15 (ref 9–23)
BUN: 14 mg/dL (ref 6–24)
Bilirubin Total: 0.5 mg/dL (ref 0.0–1.2)
CO2: 24 mmol/L (ref 20–29)
Calcium: 10 mg/dL (ref 8.7–10.2)
Chloride: 101 mmol/L (ref 96–106)
Creatinine, Ser: 0.95 mg/dL (ref 0.57–1.00)
Globulin, Total: 3 g/dL (ref 1.5–4.5)
Glucose: 111 mg/dL — ABNORMAL HIGH (ref 65–99)
Potassium: 4.7 mmol/L (ref 3.5–5.2)
Sodium: 138 mmol/L (ref 134–144)
Total Protein: 7.2 g/dL (ref 6.0–8.5)
eGFR: 72 mL/min/{1.73_m2} (ref >59)

## 2020-07-08 LAB — HEMOGLOBIN A1C
Est. average glucose Bld gHb Est-mCnc: 120 mg/dL
Hgb A1c MFr Bld: 5.8 % — ABNORMAL HIGH (ref 4.8–5.6)

## 2020-07-08 LAB — INSULIN, RANDOM: INSULIN: 37.2 u[IU]/mL — ABNORMAL HIGH (ref 2.6–24.9)

## 2020-07-08 LAB — VITAMIN D 25 HYDROXY (VIT D DEFICIENCY, FRACTURES): Vit D, 25-Hydroxy: 30.1 ng/mL (ref 30.0–100.0)

## 2020-07-08 NOTE — Progress Notes (Signed)
Chief Complaint:   OBESITY Lindsey Dunlap is here to discuss her progress with her obesity treatment plan along with follow-up of her obesity related diagnoses. [name] is on the Category 4 Plan and states she is following her eating plan approximately 60 % of the time. [name] states she is walking 15 minutes 4-5 times per week.  Today's visit was #: 8 Starting weight: 342 lbs Starting date: 03/12/2020 Today's weight: 336 lbs Today's date: 07/07/2020 Total lbs lost to date: 3 Total lbs lost since last in-office visit: 0  Interim History: [name] is up 3 lbs since her last visit. She has been stress eating some.  Subjective:   1. Essential hypertension Lindsey Dunlap's BP is low at OV today.  BP Readings from Last 3 Encounters:  07/07/20 (!) 101/58  07/02/20 126/82  06/09/20 130/80   2. Pre-diabetes Lindsey Dunlap is taking Metformin. She reports some nausea.  Lab Results  Component Value Date   HGBA1C 5.8 (H) 07/07/2020   Lab Results  Component Value Date   INSULIN WILL FOLLOW 07/07/2020   INSULIN 33.0 (H) 03/12/2020   3. Vitamin D deficiency Lindsey Dunlap is not taking any Vit D supplementation.  4. At risk for diabetes mellitus Lindsey Dunlap is at higher than average risk for developing diabetes due to obesity.   Assessment/Plan:   1. Essential hypertension Lindsey Dunlap is working on healthy weight loss and exercise to improve blood pressure control. We will watch for signs of hypotension as she continues her lifestyle modifications. Check labs today.  - Comprehensive metabolic panel  2. Pre-diabetes Lindsey Dunlap will continue to work on weight loss, exercise, and decreasing simple carbohydrates to help decrease the risk of diabetes. Check labs today.  - Hemoglobin A1c - Insulin, random  3. Vitamin D deficiency Low Vitamin D level contributes to fatigue and are associated with obesity, breast, and colon cancer. She agrees to follow-up for routine testing of Vitamin D, at least 2-3 times per year to avoid over-replacement.  Check labs today.  - VITAMIN D 25 Hydroxy (Vit-D Deficiency, Fractures)  4. At risk for diabetes mellitus Lindsey Dunlap was given approximately 15 minutes of diabetes education and counseling today. We discussed intensive lifestyle modifications today with an emphasis on weight loss as well as increasing exercise and decreasing simple carbohydrates in her diet. We also reviewed medication options with an emphasis on risk versus benefit of those discussed.   Repetitive spaced learning was employed today to elicit superior memory formation and behavioral change.  5. Obesity with current BMI 54.3  Lindsey Dunlap is currently in the action stage of change. As such, her goal is to continue with weight loss efforts. She has agreed to the Category 4 Plan.   Meal plan Intentional eating  Exercise goals: As is  Behavioral modification strategies: increasing lean protein intake, decreasing simple carbohydrates, increasing vegetables, increasing water intake, decreasing eating out, no skipping meals, meal planning and cooking strategies, keeping healthy foods in the home and planning for success.  Lindsey Dunlap has agreed to follow-up with our clinic in 3 weeks. She was informed of the importance of frequent follow-up visits to maximize her success with intensive lifestyle modifications for her multiple health conditions.   Lindsey Dunlap was informed we would discuss her lab results at her next visit unless there is a critical issue that needs to be addressed sooner. Lindsey Dunlap agreed to keep her next visit at the agreed upon time to discuss these results.  Objective:   Blood pressure (!) 101/58, pulse 69, temperature 98 F (  36.7 C), height 5\' 6"  (1.676 m), weight (!) 336 lb (152.4 kg), SpO2 94 %. Body mass index is 54.23 kg/m.  General: Cooperative, alert, well developed, in no acute distress. HEENT: Conjunctivae and lids unremarkable. Cardiovascular: Regular rhythm.  Lungs: Normal work of breathing. Neurologic: No focal deficits.    Lab Results  Component Value Date   CREATININE 0.95 07/07/2020   BUN 14 07/07/2020   NA 138 07/07/2020   K 4.7 07/07/2020   CL 101 07/07/2020   CO2 24 07/07/2020   Lab Results  Component Value Date   ALT 29 07/07/2020   AST 24 07/07/2020   ALKPHOS 115 07/07/2020   BILITOT 0.5 07/07/2020   Lab Results  Component Value Date   HGBA1C 5.8 (H) 07/07/2020   HGBA1C 6.1 02/14/2020   HGBA1C 5.6 05/01/2018   HGBA1C 5.3 01/26/2016   Lab Results  Component Value Date   INSULIN WILL FOLLOW 07/07/2020   INSULIN 33.0 (H) 03/12/2020   Lab Results  Component Value Date   TSH 0.17 (L) 02/20/2020   Lab Results  Component Value Date   CHOL 161 02/14/2020   HDL 51.10 02/14/2020   LDLCALC 87 02/14/2020   TRIG 113.0 02/14/2020   CHOLHDL 3 02/14/2020   Lab Results  Component Value Date   WBC 4.4 02/14/2020   HGB 14.7 02/14/2020   HCT 44.1 02/14/2020   MCV 84.0 02/14/2020   PLT 250.0 02/14/2020    Attestation Statements:   Reviewed by clinician on day of visit: allergies, medications, problem list, medical history, surgical history, family history, social history, and previous encounter notes.  Time spent on visit including pre-visit chart review and post-visit care and charting was 20 minutes.   02/16/2020, CMA, am acting as Edmund Hilda for Energy manager, DO.  I have reviewed the above documentation for accuracy and completeness, and I agree with the above. [employer], DO

## 2020-07-27 DIAGNOSIS — G4733 Obstructive sleep apnea (adult) (pediatric): Secondary | ICD-10-CM | POA: Diagnosis not present

## 2020-07-30 ENCOUNTER — Encounter (INDEPENDENT_AMBULATORY_CARE_PROVIDER_SITE_OTHER): Payer: Self-pay | Admitting: Bariatrics

## 2020-07-30 ENCOUNTER — Other Ambulatory Visit: Payer: Self-pay

## 2020-07-30 ENCOUNTER — Ambulatory Visit (INDEPENDENT_AMBULATORY_CARE_PROVIDER_SITE_OTHER): Payer: BC Managed Care – PPO | Admitting: Bariatrics

## 2020-07-30 VITALS — BP 94/63 | HR 68 | Temp 98.3°F | Ht 66.0 in | Wt 339.0 lb

## 2020-07-30 DIAGNOSIS — Z9189 Other specified personal risk factors, not elsewhere classified: Secondary | ICD-10-CM | POA: Diagnosis not present

## 2020-07-30 DIAGNOSIS — I10 Essential (primary) hypertension: Secondary | ICD-10-CM

## 2020-07-30 DIAGNOSIS — Z6841 Body Mass Index (BMI) 40.0 and over, adult: Secondary | ICD-10-CM

## 2020-07-30 DIAGNOSIS — R7303 Prediabetes: Secondary | ICD-10-CM

## 2020-07-30 DIAGNOSIS — E662 Morbid (severe) obesity with alveolar hypoventilation: Secondary | ICD-10-CM

## 2020-07-30 MED ORDER — OZEMPIC (0.25 OR 0.5 MG/DOSE) 2 MG/1.5ML ~~LOC~~ SOPN: 0.2500 mg | PEN_INJECTOR | SUBCUTANEOUS | 0 refills | Status: DC

## 2020-07-30 MED ORDER — METFORMIN HCL 500 MG PO TABS: 1.0000 | ORAL_TABLET | Freq: Two times a day (BID) | ORAL | 0 refills | Status: DC

## 2020-08-04 NOTE — Progress Notes (Signed)
Chief Complaint:   OBESITY Lindsey Dunlap is here to discuss her progress with her obesity treatment plan along with follow-up of her obesity related diagnoses. Lindsey Dunlap is on the Category 4 Plan and states she is following her eating plan approximately 0% of the time. Lindsey Dunlap states she is walking for 20 minutes 3 times per week.  Today's visit was #: 9 Starting weight: 342 lbs Starting date: 03/12/2020 Today's weight: 339 lbs Today's date: 07/30/2020 Total lbs lost to date: 3 Total lbs lost since last in-office visit: 0  Interim History: Lindsey Dunlap is up 3 lbs since her last visit. She is doing well with protein and water.  Subjective:   1. Essential hypertension Lindsey Dunlap is taking hydrochlorothiazide, and her blood pressure is controlled.  2. Pre-diabetes Lindsey Dunlap last A1c was 5.8, down from 6.1. She is taking metformin as prescribed.  3. At risk for hypoglycemia Lindsey Dunlap is at increased risk for hypoglycemia due to pre-diabetes, and changes in diet.   Assessment/Plan:   1. Essential hypertension Lindsey Dunlap will continue her medications, and will continue working on healthy weight loss and exercise to improve blood pressure control. We will watch for signs of hypotension as she continues her lifestyle modifications.  2. Pre-diabetes Lindsey Dunlap agreed to start Ozempic 0.25 mg SubQ weekly with no refills, and we will refill metformin for 1 month. She will continue to work on weight loss, exercise, and decreasing simple carbohydrates to help decrease the risk of diabetes.    - metFORMIN (GLUCOPHAGE) 500 MG tablet; Take 1 tablet (500 mg total) by mouth 2 (two) times daily with a meal.  Dispense: 60 tablet; Refill: 0 - Semaglutide,0.25 or 0.5MG /DOS, (OZEMPIC, 0.25 OR 0.5 MG/DOSE,) 2 MG/1.5ML SOPN; Inject 0.25 mg into the skin once a week.  Dispense: 1.5 mL; Refill: 0  3. At risk for hypoglycemia Lindsey Dunlap was given approximately 15 minutes of counseling today regarding prevention of hypoglycemia. She was advised of symptoms  of hypoglycemia. Lindsey Dunlap was instructed to avoid skipping meals, eat regular protein rich meals and schedule low calorie snacks as needed.   Repetitive spaced learning was employed today to elicit superior memory formation and behavioral change  4. Obesity with current BMI 54 Lindsey Dunlap is currently in the action stage of change. As such, her goal is to continue with weight loss efforts. She has agreed to the Category 4 Plan.   Intentional eating was discussed. I reviewed labs from 07/07/2020 with the patient today.  Exercise goals: As is.  Behavioral modification strategies: increasing lean protein intake, decreasing simple carbohydrates, increasing vegetables, increasing water intake, decreasing eating out, no skipping meals, meal planning and cooking strategies, keeping healthy foods in the home and planning for success.  Lindsey Dunlap has agreed to follow-up with our clinic in 2 to 3 weeks. She was informed of the importance of frequent follow-up visits to maximize her success with intensive lifestyle modifications for her multiple health conditions.   Objective:   Blood pressure 94/63, pulse 68, temperature 98.3 F (36.8 C), height 5\' 6"  (1.676 m), weight (!) 339 lb (153.8 kg), SpO2 95 %. Body mass index is 54.72 kg/m.  General: Cooperative, alert, well developed, in no acute distress. HEENT: Conjunctivae and lids unremarkable. Cardiovascular: Regular rhythm.  Lungs: Normal work of breathing. Neurologic: No focal deficits.   Lab Results  Component Value Date   CREATININE 0.95 07/07/2020   BUN 14 07/07/2020   NA 138 07/07/2020   K 4.7 07/07/2020   CL 101 07/07/2020   CO2 24  07/07/2020   Lab Results  Component Value Date   ALT 29 07/07/2020   AST 24 07/07/2020   ALKPHOS 115 07/07/2020   BILITOT 0.5 07/07/2020   Lab Results  Component Value Date   HGBA1C 5.8 (H) 07/07/2020   HGBA1C 6.1 02/14/2020   HGBA1C 5.6 05/01/2018   HGBA1C 5.3 01/26/2016   Lab Results  Component Value Date    INSULIN 37.2 (H) 07/07/2020   INSULIN 33.0 (H) 03/12/2020   Lab Results  Component Value Date   TSH 0.17 (L) 02/20/2020   Lab Results  Component Value Date   CHOL 161 02/14/2020   HDL 51.10 02/14/2020   LDLCALC 87 02/14/2020   TRIG 113.0 02/14/2020   CHOLHDL 3 02/14/2020   Lab Results  Component Value Date   WBC 4.4 02/14/2020   HGB 14.7 02/14/2020   HCT 44.1 02/14/2020   MCV 84.0 02/14/2020   PLT 250.0 02/14/2020   No results found for: IRON, TIBC, FERRITIN  Attestation Statements:   Reviewed by clinician on day of visit: allergies, medications, problem list, medical history, surgical history, family history, social history, and previous encounter notes.   Trude Mcburney, am acting as Energy manager for [employer], DO.  I have reviewed the above documentation for accuracy and completeness, and I agree with the above. Corinna Capra, DO

## 2020-08-06 ENCOUNTER — Encounter (INDEPENDENT_AMBULATORY_CARE_PROVIDER_SITE_OTHER): Payer: Self-pay | Admitting: Bariatrics

## 2020-08-14 ENCOUNTER — Encounter (INDEPENDENT_AMBULATORY_CARE_PROVIDER_SITE_OTHER): Payer: Self-pay

## 2020-08-18 ENCOUNTER — Ambulatory Visit (INDEPENDENT_AMBULATORY_CARE_PROVIDER_SITE_OTHER): Payer: BC Managed Care – PPO | Admitting: Bariatrics

## 2020-08-27 DIAGNOSIS — G4733 Obstructive sleep apnea (adult) (pediatric): Secondary | ICD-10-CM | POA: Diagnosis not present

## 2020-08-29 DIAGNOSIS — G4733 Obstructive sleep apnea (adult) (pediatric): Secondary | ICD-10-CM | POA: Diagnosis not present

## 2020-09-08 ENCOUNTER — Encounter (INDEPENDENT_AMBULATORY_CARE_PROVIDER_SITE_OTHER): Payer: Self-pay | Admitting: Bariatrics

## 2020-09-08 ENCOUNTER — Ambulatory Visit (INDEPENDENT_AMBULATORY_CARE_PROVIDER_SITE_OTHER): Payer: BC Managed Care – PPO | Admitting: Bariatrics

## 2020-09-08 ENCOUNTER — Other Ambulatory Visit: Payer: Self-pay

## 2020-09-08 VITALS — BP 131/80 | HR 69 | Temp 98.0°F | Ht 66.0 in | Wt 336.0 lb

## 2020-09-08 DIAGNOSIS — Z9189 Other specified personal risk factors, not elsewhere classified: Secondary | ICD-10-CM | POA: Diagnosis not present

## 2020-09-08 DIAGNOSIS — R7303 Prediabetes: Secondary | ICD-10-CM

## 2020-09-08 DIAGNOSIS — I10 Essential (primary) hypertension: Secondary | ICD-10-CM

## 2020-09-08 DIAGNOSIS — Z6841 Body Mass Index (BMI) 40.0 and over, adult: Secondary | ICD-10-CM | POA: Diagnosis not present

## 2020-09-08 MED ORDER — OZEMPIC (0.25 OR 0.5 MG/DOSE) 2 MG/1.5ML ~~LOC~~ SOPN: 0.5000 mg | PEN_INJECTOR | SUBCUTANEOUS | 0 refills | Status: DC

## 2020-09-08 MED ORDER — METFORMIN HCL 500 MG PO TABS: 500.0000 mg | ORAL_TABLET | Freq: Two times a day (BID) | ORAL | 0 refills | Status: DC

## 2020-09-08 MED ORDER — OZEMPIC (0.25 OR 0.5 MG/DOSE) 2 MG/1.5ML ~~LOC~~ SOPN
0.2500 mg | PEN_INJECTOR | SUBCUTANEOUS | 0 refills | Status: DC
Start: 2020-09-08 — End: 2020-09-08

## 2020-09-10 ENCOUNTER — Encounter (INDEPENDENT_AMBULATORY_CARE_PROVIDER_SITE_OTHER): Payer: Self-pay | Admitting: Bariatrics

## 2020-09-10 NOTE — Progress Notes (Signed)
Chief Complaint:   OBESITY Lindsey Dunlap is here to discuss her progress with her obesity treatment plan along with follow-up of her obesity related diagnoses. Lindsey Dunlap is on the Category 4 Plan and states she is following her eating plan approximately 30% of the time. 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  Today's visit was #: 10 Starting weight: 342 lbs Starting date: 03/12/2020 Today's weight: 336 lbs Today's date: 09/08/2020 Total lbs lost to date: 6 lbs Total lbs lost since last in-office visit: 3 lbs  Interim History: Lindsey Dunlap is down an additional 3 lbs since her last visit.  Subjective:   1. Pre-diabetes Lindsey Dunlap is taking Metformin and Ozempic with no side effects.  2. Essential hypertension Lindsey Dunlap is taking Hydrochlorothiazide. Her hypertension is controlled.  3. At risk for side effect of medication Lindsey Dunlap is at risk for drug side effects due to Ozempic ( diarrhea, constipation, and/or nausea and vomiting.   Assessment/Plan:   1. Pre-diabetes Lindsey Dunlap will continue to work on weight loss, exercise, and decreasing simple carbohydrates to help decrease the risk of diabetes. Lindsey Dunlap will continue medications, and we will refill Meftormin and Ozempic for 1 month with no refills.    - metFORMIN (GLUCOPHAGE) 500 MG tablet; Take 1 tablet (500 mg total) by mouth 2 (two) times daily with a meal.  Dispense: 60 tablet; Refill: 0 - Semaglutide,0.25 or 0.5MG /DOS, (OZEMPIC, 0.25 OR 0.5 MG/DOSE,) 2 MG/1.5ML SOPN; Inject 0.5 mg into the skin once a week.  Dispense: 1.5 mL; Refill: 0  2. Essential hypertension Lindsey Dunlap will continue taking hydrochlorothiazide, and will continue working on healthy weight loss and exercise to improve blood pressure control. We will watch for signs of hypotension as she continues her lifestyle modifications.   3. At risk for side effect of medication Lindsey Dunlap was given approximately 15 minutes of drug side effect counseling today.  We discussed side effect possibility and  risk versus benefits. Lindsey Dunlap agreed to the medication and will contact this office if these side effects are intolerable.  Repetitive spaced learning was employed today to elicit superior memory formation and behavioral change.  4. Obesity, current BMI 54.3 Lindsey Dunlap is currently in the action stage of change. As such, her goal is to continue with weight loss efforts. She has agreed to the Category 4 Plan.   Lindsey Dunlap will continue meal planning.  She will continue intentional eating.  Ex[facility]  Behavioral modification strategies: increasing lean protein intake, decreasing simple carbohydrates, increasing vegetables, increasing water intake, decreasing eating out, no skipping meals, meal planning and cooking strategies, keeping healthy foods in the home, and planning for success.  Lindsey Dunlap has agreed to follow-up with our clinic in 2-3 weeks. She was informed of the importance of frequent follow-up visits to maximize her success with intensive lifestyle modifications for her multiple health conditions.   Objective:   Blood pressure 131/80, pulse 69, temperature 98 F (36.7 C), height 5\' 6"  (1.676 m), weight (!) 336 lb (152.4 kg), SpO2 95 %. Body mass index is 54.23 kg/m.  General: Cooperative, alert, well developed, in no acute distress. HEENT: Conjunctivae and lids unremarkable. Cardiovascular: Regular rhythm.  Lungs: Normal work of breathing. Neurologic: No focal deficits.   Lab Results  Component Value Date   CREATININE 0.95 07/07/2020   BUN 14 07/07/2020   NA 138 07/07/2020   K 4.7 07/07/2020   CL 101 07/07/2020   CO2 24 07/07/2020   Lab  Results  Component Value Date   ALT 29 07/07/2020   AST 24 07/07/2020   ALKPHOS 115 07/07/2020   BILITOT 0.5 07/07/2020   Lab Results  Component Value Date   HGBA1C 5.8 (H) 07/07/2020   HGBA1C 6.1 02/14/2020   HGBA1C 5.6 05/01/2018   HGBA1C 5.3 01/26/2016   Lab Results  Component Value Date    INSULIN 37.2 (H) 07/07/2020   INSULIN 33.0 (H) 03/12/2020   Lab Results  Component Value Date   TSH 0.17 (L) 02/20/2020   Lab Results  Component Value Date   CHOL 161 02/14/2020   HDL 51.10 02/14/2020   LDLCALC 87 02/14/2020   TRIG 113.0 02/14/2020   CHOLHDL 3 02/14/2020   Lab Results  Component Value Date   VD25OH 30.1 07/07/2020   VD25OH 19.14 (L) 02/20/2020   VD25OH 26.60 (L) 01/26/2016   Lab Results  Component Value Date   WBC 4.4 02/14/2020   HGB 14.7 02/14/2020   HCT 44.1 02/14/2020   MCV 84.0 02/14/2020   PLT 250.0 02/14/2020   No results found for: IRON, TIBC, FERRITIN  Attestation Statements:   Reviewed by clinician on day of visit: allergies, medications, problem list, medical history, surgical history, family history, social history, and previous encounter notes.  I, Jackson Latino, RMA, am acting as Energy manager for [employer], DO.  I have reviewed the above documentation for accuracy and completeness, and I agree with the above. Corinna Capra, DO

## 2020-09-29 ENCOUNTER — Ambulatory Visit (INDEPENDENT_AMBULATORY_CARE_PROVIDER_SITE_OTHER): Payer: BC Managed Care – PPO | Admitting: Bariatrics

## 2020-11-06 DIAGNOSIS — G4733 Obstructive sleep apnea (adult) (pediatric): Secondary | ICD-10-CM | POA: Diagnosis not present

## 2020-11-19 ENCOUNTER — Telehealth: Payer: Self-pay

## 2020-11-19 NOTE — Telephone Encounter (Signed)
Order received for C pap supplies. This is managed by pulmonology. Have faxed order their office. No further action at this time.

## 2020-12-02 ENCOUNTER — Other Ambulatory Visit: Payer: Self-pay | Admitting: Primary Care

## 2020-12-02 DIAGNOSIS — I10 Essential (primary) hypertension: Secondary | ICD-10-CM

## 2021-02-04 DIAGNOSIS — G4733 Obstructive sleep apnea (adult) (pediatric): Secondary | ICD-10-CM | POA: Diagnosis not present

## 2021-02-05 ENCOUNTER — Other Ambulatory Visit: Payer: Self-pay | Admitting: Primary Care

## 2021-02-05 DIAGNOSIS — F411 Generalized anxiety disorder: Secondary | ICD-10-CM

## 2021-02-20 ENCOUNTER — Encounter: Payer: BC Managed Care – PPO | Admitting: Primary Care

## 2021-03-07 DIAGNOSIS — G4733 Obstructive sleep apnea (adult) (pediatric): Secondary | ICD-10-CM | POA: Diagnosis not present

## 2021-03-24 ENCOUNTER — Other Ambulatory Visit: Payer: Self-pay

## 2021-03-24 ENCOUNTER — Ambulatory Visit (INDEPENDENT_AMBULATORY_CARE_PROVIDER_SITE_OTHER): Payer: BC Managed Care – PPO | Admitting: Primary Care

## 2021-03-24 ENCOUNTER — Encounter: Payer: Self-pay | Admitting: Primary Care

## 2021-03-24 VITALS — BP 120/76 | HR 78 | Temp 98.6°F | Ht 66.0 in | Wt 351.0 lb

## 2021-03-24 DIAGNOSIS — Z0001 Encounter for general adult medical examination with abnormal findings: Secondary | ICD-10-CM

## 2021-03-24 DIAGNOSIS — Z23 Encounter for immunization: Secondary | ICD-10-CM | POA: Diagnosis not present

## 2021-03-24 DIAGNOSIS — I10 Essential (primary) hypertension: Secondary | ICD-10-CM | POA: Diagnosis not present

## 2021-03-24 DIAGNOSIS — J302 Other seasonal allergic rhinitis: Secondary | ICD-10-CM

## 2021-03-24 DIAGNOSIS — M25541 Pain in joints of right hand: Secondary | ICD-10-CM

## 2021-03-24 DIAGNOSIS — F411 Generalized anxiety disorder: Secondary | ICD-10-CM

## 2021-03-24 DIAGNOSIS — R7303 Prediabetes: Secondary | ICD-10-CM | POA: Diagnosis not present

## 2021-03-24 DIAGNOSIS — G8929 Other chronic pain: Secondary | ICD-10-CM

## 2021-03-24 DIAGNOSIS — Z1159 Encounter for screening for other viral diseases: Secondary | ICD-10-CM

## 2021-03-24 DIAGNOSIS — K219 Gastro-esophageal reflux disease without esophagitis: Secondary | ICD-10-CM

## 2021-03-24 DIAGNOSIS — M25542 Pain in joints of left hand: Secondary | ICD-10-CM

## 2021-03-24 DIAGNOSIS — R519 Headache, unspecified: Secondary | ICD-10-CM

## 2021-03-24 DIAGNOSIS — Z114 Encounter for screening for human immunodeficiency virus [HIV]: Secondary | ICD-10-CM | POA: Diagnosis not present

## 2021-03-24 DIAGNOSIS — M2550 Pain in unspecified joint: Secondary | ICD-10-CM

## 2021-03-24 LAB — BASIC METABOLIC PANEL
BUN: 19 mg/dL (ref 6–23)
CO2: 30 mEq/L (ref 19–32)
Calcium: 10.3 mg/dL (ref 8.4–10.5)
Chloride: 101 mEq/L (ref 96–112)
Creatinine, Ser: 0.94 mg/dL (ref 0.40–1.20)
GFR: 69.47 mL/min (ref >60.00)
Glucose, Bld: 94 mg/dL (ref 70–99)
Potassium: 3.9 mEq/L (ref 3.5–5.1)
Sodium: 138 mEq/L (ref 135–145)

## 2021-03-24 LAB — LIPID PANEL
Cholesterol: 170 mg/dL (ref 0–200)
HDL: 48.7 mg/dL (ref >39.00)
LDL Cholesterol: 92 mg/dL (ref 0–99)
NonHDL: 121.25
Total CHOL/HDL Ratio: 3
Triglycerides: 146 mg/dL (ref 0.0–149.0)
VLDL: 29.2 mg/dL (ref 0.0–40.0)

## 2021-03-24 LAB — HEMOGLOBIN A1C: Hgb A1c MFr Bld: 6.1 % (ref 4.6–6.5)

## 2021-03-24 LAB — CBC
HCT: 43.7 % (ref 36.0–46.0)
Hemoglobin: 14.6 g/dL (ref 12.0–15.0)
MCHC: 33.3 g/dL (ref 30.0–36.0)
MCV: 86.3 fl (ref 78.0–100.0)
Platelets: 232 10*3/uL (ref 150.0–400.0)
RBC: 5.07 Mil/uL (ref 3.87–5.11)
RDW: 12.9 % (ref 11.5–15.5)
WBC: 5.9 10*3/uL (ref 4.0–10.5)

## 2021-03-24 MED ORDER — SERTRALINE HCL 50 MG PO TABS: 50.0000 mg | ORAL_TABLET | Freq: Every day | ORAL | 0 refills | Status: DC

## 2021-03-24 NOTE — Progress Notes (Signed)
Subjective:    Patient ID: Lindsey Dunlap, female    DOB: Jul 23, 1965, 56 y.o.   MRN: [id number]  HPI  Lindsey Dunlap is a very pleasant 56 y.o. female who presents today for complete physical and follow up of chronic conditions. She would also like to discuss anxiety and headaches.  She suspects she needs to change anxiety medication. She's noticed symptoms of right sided facial twitching, chest pressure, feeling anxious, worrying. She felt well on fluoxetine initially, but over the last 6 months feels like it's no longer effective. She would like to switch to something else. She was once managed on Zoloft for 2-3 months years ago, doesn't recall any bad effects.   Chronic headaches for the last 3 months, occurring a few times weekly, resolves within a few hours or when taking Tylenol. All headaches are located to the frontal lobes. Prior history of migraines years ago, these don't feel similar.   Immunizations: -Tetanus: 2013 -Influenza: Due today  -Covid-19: 2 vaccines -Shingles: Completed Shingrix  Diet: Fair diet. She recently began weight watchers  Exercise: No regular exercise. Active at home.   Eye exam: Completes annually  Dental exam: Completes semi-annually   Pap Smear: Completed at GYN office, [facility], is UTD Mammogram: Completed at GYN office, UTD Colonoscopy: Completed in 2020, due 2030   BP Readings from Last 3 Encounters:  03/24/21 120/76  09/08/20 131/80  07/30/20 94/63      Review of Systems  Constitutional:  Negative for unexpected weight change.  HENT:  Negative for rhinorrhea.   Eyes:  Negative for visual disturbance.  Respiratory:  Negative for cough and shortness of breath.   Cardiovascular:  Negative for chest pain.  Gastrointestinal:  Negative for constipation and diarrhea.  Genitourinary:  Negative for difficulty urinating.  Musculoskeletal:  Positive for arthralgias. Negative for myalgias.  Skin:  Negative for rash.   Allergic/Immunologic: Negative for environmental allergies.  Neurological:  Positive for headaches. Negative for dizziness.  Psychiatric/Behavioral:  The patient is nervous/anxious.         Past Medical History:  Diagnosis Date   Anxiety    Chickenpox    GERD (gastroesophageal reflux disease)    High blood pressure    Joint pain    Knee pain    Night sweats    Seasonal allergies    SOB (shortness of breath)    Vitamin D deficiency     Social History   Socioeconomic History   Marital status: Married    Spouse name: Jonny Ruiz   Number of children: 2   Years of education: Not on file   Highest education level: Not on file  Occupational History   Occupation: Presenter, broadcasting  Tobacco Use   Smoking status: Former    Packs/day: 1.00    Years: 22.00    Pack years: 22.00    Types: Cigarettes    Quit date: 04/25/2008    Years since quitting: 12.9   Smokeless tobacco: Never  Vaping Use   Vaping Use: Never used  Substance and Sexual Activity   Alcohol use: No   Drug use: Never   Sexual activity: Yes  Other Topics Concern   Not on file  Social History Narrative   Married.   2 children.   Works as a Futures trader.   Enjoys reading, working on projects   Social Determinants of Corporate investment banker Strain: Not on file  Food Insecurity: Not on file  Transportation Needs: Not on file  Physical Activity: Not on file  Stress: Not on file  Social Connections: Not on file  Intimate Partner Violence: Not on file    Past Surgical History:  Procedure Laterality Date   ABLATION     COLONOSCOPY WITH PROPOFOL N/A 12/12/2018   Procedure: COLONOSCOPY WITH PROPOFOL;  Surgeon: Hilarie Fredrickson, MD;  Location: [facility];  Service: Endoscopy;  Laterality: N/A;   TONSILLECTOMY  1991    Family History  Problem Relation Age of Onset   Hyperlipidemia Mother    Hypertension Mother    Sleep apnea Mother    Arthritis/Rheumatoid Sister    Hypertension Maternal Grandmother     Hypertension Maternal Grandfather    High blood pressure Father    Liver cancer Maternal Uncle    Uterine cancer Neg Hx    Colon polyps Neg Hx    Esophageal cancer Neg Hx    Pancreatic cancer Neg Hx    Stomach cancer Neg Hx    Rectal cancer Neg Hx     No Known Allergies  Current Outpatient Medications on File Prior to Visit  Medication Sig Dispense Refill   celecoxib (CELEBREX) 100 MG capsule Take 100 mg by mouth 2 (two) times daily.     famotidine (PEPCID) 20 MG tablet Take 20 mg by mouth at bedtime.     FLUoxetine (PROZAC) 40 MG capsule Take 1 capsule (40 mg total) by mouth daily. For anxiety. Office visit required for further refills. 30 capsule 0   hydrochlorothiazide (HYDRODIURIL) 25 MG tablet Take 1 tablet (25 mg total) by mouth daily. Office visit required in late December for further refills. 90 tablet 0   magnesium 30 MG tablet Take 1.25 mg by mouth 2 (two) times daily.     No current facility-administered medications on file prior to visit.    BP 120/76    Pulse 78    Temp 98.6 F (37 C) (Temporal)    Ht 5\' 6"  (1.676 m)    Wt (!) 351 lb (159.2 kg)    SpO2 95%    BMI 56.65 kg/m  Objective:   Physical Exam HENT:     Right Ear: Tympanic membrane and ear canal normal.     Left Ear: Tympanic membrane and ear canal normal.     Nose: Nose normal.  Eyes:     Conjunctiva/sclera: Conjunctivae normal.     Pupils: Pupils are equal, round, and reactive to light.  Neck:     Thyroid: No thyromegaly.  Cardiovascular:     Rate and Rhythm: Normal rate and regular rhythm.     Heart sounds: No murmur heard. Pulmonary:     Effort: Pulmonary effort is normal.     Breath sounds: Normal breath sounds. No rales.  Abdominal:     General: Bowel sounds are normal.     Palpations: Abdomen is soft.     Tenderness: There is no abdominal tenderness.  Musculoskeletal:        General: Normal range of motion.     Cervical back: Neck supple.  Lymphadenopathy:     Cervical: No cervical  adenopathy.  Skin:    General: Skin is warm and dry.     Findings: No rash.  Neurological:     Mental Status: She is alert and oriented to person, place, and time.     Cranial Nerves: No cranial nerve deficit.     Deep Tendon Reflexes: Reflexes are normal and symmetric.  Psychiatric:        Mood  and Affect: Mood normal.          Assessment & Plan:      This visit occurred during the SARS-CoV-2 public health emergency.  Safety protocols were in place, including screening questions prior to the visit, additional usage of staff PPE, and extensive cleaning of exam room while observing appropriate contact time as indicated for disinfecting solutions.

## 2021-03-24 NOTE — Assessment & Plan Note (Signed)
Influenza vaccine provided today. Pap smear UTD, follows with GYN. Mammogram UTD, follows with GYN.  Colonoscopy UTD, due 2030.  Discussed the importance of a healthy diet and regular exercise in order for weight loss, and to reduce the risk of further co-morbidity.  Exam today as noted. Labs pending.

## 2021-03-24 NOTE — Assessment & Plan Note (Addendum)
Discussed the importance of a healthy diet and regular exercise in order for weight loss, and to reduce the risk of further co-morbidity.  Repeat A1C and insulin random pending.

## 2021-03-24 NOTE — Assessment & Plan Note (Signed)
Chronic for 3 months, overall tolerable per patient.  BP under good control. Discussed to increase water intake.  She will update if symptoms become bothersome and/or if headache intensity/frequency increase.

## 2021-03-24 NOTE — Patient Instructions (Addendum)
Stop by the lab prior to leaving today. I will notify you of your results once received.   Stop taking fluoxetine 40 mg for anxiety.  Start taking sertraline (Zoloft) 50 mg once daily for anxiety.  It was a pleasure to see you today!  Preventive Care 62-54 Years Old, Female Preventive care refers to lifestyle choices and visits with your health care provider that can promote health and wellness. Preventive care visits are also called wellness exams. What can I expect for my preventive care visit? Counseling Your health care provider may ask you questions about your: Medical history, including: Past medical problems. Family medical history. Pregnancy history. Current health, including: Menstrual cycle. Method of birth control. Emotional well-being. Home life and relationship well-being. Sexual activity and sexual health. Lifestyle, including: Alcohol, nicotine or tobacco, and drug use. Access to firearms. Diet, exercise, and sleep habits. Work and work Statistician. Sunscreen use. Safety issues such as seatbelt and bike helmet use. Physical exam Your health care provider will check your: Height and weight. These may be used to calculate your BMI (body mass index). BMI is a measurement that tells if you are at a healthy weight. Waist circumference. This measures the distance around your waistline. This measurement also tells if you are at a healthy weight and may help predict your risk of certain diseases, such as type 2 diabetes and high blood pressure. Heart rate and blood pressure. Body temperature. Skin for abnormal spots. What immunizations do I need? Vaccines are usually given at various ages, according to a schedule. Your health care provider will recommend vaccines for you based on your age, medical history, and lifestyle or other factors, such as travel or where you work. What tests do I need? Screening Your health care provider may recommend screening tests for certain  conditions. This may include: Lipid and cholesterol levels. Diabetes screening. This is done by checking your blood sugar (glucose) after you have not eaten for a while (fasting). Pelvic exam and Pap test. Hepatitis B test. Hepatitis C test. HIV (human immunodeficiency virus) test. STI (sexually transmitted infection) testing, if you are at risk. Lung cancer screening. Colorectal cancer screening. Mammogram. Talk with your health care provider about when you should start having regular mammograms. This may depend on whether you have a family history of breast cancer. BRCA-related cancer screening. This may be done if you have a family history of breast, ovarian, tubal, or peritoneal cancers. Bone density scan. This is done to screen for osteoporosis. Talk with your health care provider about your test results, treatment options, and if necessary, the need for more tests. Follow these instructions at home: Eating and drinking  Eat a diet that includes fresh fruits and vegetables, whole grains, lean protein, and low-fat dairy products. Take vitamin and mineral supplements as recommended by your health care provider. Do not drink alcohol if: Your health care provider tells you not to drink. You are pregnant, may be pregnant, or are planning to become pregnant. If you drink alcohol: Limit how much you have to 0-1 drink a day. Know how much alcohol is in your drink. In the [place] one drink equals one 12 oz bottle of beer (355 mL), one 5 oz glass of wine (148 mL), or one 1 oz glass of hard liquor (44 mL). Lifestyle Brush your teeth every morning and night with fluoride toothpaste. Floss one time each day. Exercise for at least 30 minutes 5 or more days each week. Do not use any products that contain  nicotine or tobacco. These products include cigarettes, chewing tobacco, and vaping devices, such as e-cigarettes. If you need help quitting, ask your health care provider. Do not use drugs. If  you are sexually active, practice safe sex. Use a condom or other form of protection to prevent STIs. If you do not wish to become pregnant, use a form of birth control. If you plan to become pregnant, see your health care provider for a prepregnancy visit. Take aspirin only as told by your health care provider. Make sure that you understand how much to take and what form to take. Work with your health care provider to find out whether it is safe and beneficial for you to take aspirin daily. Find healthy ways to manage stress, such as: Meditation, yoga, or listening to music. Journaling. Talking to a trusted person. Spending time with friends and family. Minimize exposure to UV radiation to reduce your risk of skin cancer. Safety Always wear your seat belt while driving or riding in a vehicle. Do not drive: If you have been drinking alcohol. Do not ride with someone who has been drinking. When you are tired or distracted. While texting. If you have been using any mind-altering substances or drugs. Wear a helmet and other protective equipment during sports activities. If you have firearms in your house, make sure you follow all gun safety procedures. Seek help if you have been physically or sexually abused. What's next? Visit your health care provider once a year for an annual wellness visit. Ask your health care provider how often you should have your eyes and teeth checked. Stay up to date on all vaccines. This information is not intended to replace advice given to you by your health care provider. Make sure you discuss any questions you have with your health care provider. Document Revised: 08/13/2020 Document Reviewed: 08/13/2020 Elsevier Patient Education  [place].

## 2021-03-24 NOTE — Assessment & Plan Note (Signed)
Chronic.  Stable. Continue to monitor. 

## 2021-03-24 NOTE — Assessment & Plan Note (Signed)
Fluoxetine 40 mg ineffective per patient.  Discussed options including increasing dose to 60 mg vs switching to Zoloft. She opts to switch to Zoloft.  Rx for Zoloft 50 mg sent to pharmacy. Stop fluoxetine 40 mg.  She will update.

## 2021-03-24 NOTE — Assessment & Plan Note (Signed)
Controlled in the office today.  Continue HCTZ 25 mg. CMP pending.

## 2021-03-24 NOTE — Assessment & Plan Note (Signed)
Infrequent symptoms.  Continue famotidine 20 mg.

## 2021-03-24 NOTE — Assessment & Plan Note (Signed)
Denies concerns today. °Continue to monitor. °

## 2021-03-24 NOTE — Assessment & Plan Note (Signed)
Stable.  Continue Celebrex 100 mg BID PRN.

## 2021-03-25 LAB — HEPATITIS C ANTIBODY
Hepatitis C Ab: NONREACTIVE
SIGNAL TO CUT-OFF: 0.05 (ref <1.00)

## 2021-03-25 LAB — HIV ANTIBODY (ROUTINE TESTING W REFLEX): HIV 1&2 Ab, 4th Generation: NONREACTIVE

## 2021-03-25 LAB — INSULIN, RANDOM: Insulin: 24.4 u[IU]/mL — ABNORMAL HIGH

## 2021-04-07 DIAGNOSIS — G4733 Obstructive sleep apnea (adult) (pediatric): Secondary | ICD-10-CM | POA: Diagnosis not present

## 2021-04-30 ENCOUNTER — Other Ambulatory Visit: Payer: Self-pay | Admitting: Primary Care

## 2021-04-30 DIAGNOSIS — I10 Essential (primary) hypertension: Secondary | ICD-10-CM

## 2021-05-05 DIAGNOSIS — G4733 Obstructive sleep apnea (adult) (pediatric): Secondary | ICD-10-CM | POA: Diagnosis not present

## 2021-06-04 ENCOUNTER — Other Ambulatory Visit: Payer: Self-pay | Admitting: Primary Care

## 2021-06-04 DIAGNOSIS — F411 Generalized anxiety disorder: Secondary | ICD-10-CM

## 2021-06-05 DIAGNOSIS — G4733 Obstructive sleep apnea (adult) (pediatric): Secondary | ICD-10-CM | POA: Diagnosis not present

## 2021-07-05 DIAGNOSIS — G4733 Obstructive sleep apnea (adult) (pediatric): Secondary | ICD-10-CM | POA: Diagnosis not present

## 2021-09-18 DIAGNOSIS — G4733 Obstructive sleep apnea (adult) (pediatric): Secondary | ICD-10-CM | POA: Diagnosis not present

## 2021-10-02 DIAGNOSIS — G4733 Obstructive sleep apnea (adult) (pediatric): Secondary | ICD-10-CM | POA: Diagnosis not present

## 2021-10-07 ENCOUNTER — Encounter (INDEPENDENT_AMBULATORY_CARE_PROVIDER_SITE_OTHER): Payer: Self-pay

## 2021-11-02 DIAGNOSIS — G4733 Obstructive sleep apnea (adult) (pediatric): Secondary | ICD-10-CM | POA: Diagnosis not present

## 2021-11-17 DIAGNOSIS — D485 Neoplasm of uncertain behavior of skin: Secondary | ICD-10-CM | POA: Diagnosis not present

## 2021-11-17 DIAGNOSIS — D1801 Hemangioma of skin and subcutaneous tissue: Secondary | ICD-10-CM | POA: Diagnosis not present

## 2021-11-17 DIAGNOSIS — D2239 Melanocytic nevi of other parts of face: Secondary | ICD-10-CM | POA: Diagnosis not present

## 2021-11-17 DIAGNOSIS — D2372 Other benign neoplasm of skin of left lower limb, including hip: Secondary | ICD-10-CM | POA: Diagnosis not present

## 2021-11-17 DIAGNOSIS — D2371 Other benign neoplasm of skin of right lower limb, including hip: Secondary | ICD-10-CM | POA: Diagnosis not present

## 2021-11-17 DIAGNOSIS — L814 Other melanin hyperpigmentation: Secondary | ICD-10-CM | POA: Diagnosis not present

## 2021-11-26 ENCOUNTER — Emergency Department (HOSPITAL_BASED_OUTPATIENT_CLINIC_OR_DEPARTMENT_OTHER): Payer: BC Managed Care – PPO | Admitting: Radiology

## 2021-11-26 ENCOUNTER — Encounter (HOSPITAL_BASED_OUTPATIENT_CLINIC_OR_DEPARTMENT_OTHER): Payer: Self-pay | Admitting: Emergency Medicine

## 2021-11-26 ENCOUNTER — Other Ambulatory Visit: Payer: Self-pay

## 2021-11-26 ENCOUNTER — Telehealth: Payer: Self-pay | Admitting: Primary Care

## 2021-11-26 ENCOUNTER — Emergency Department (HOSPITAL_BASED_OUTPATIENT_CLINIC_OR_DEPARTMENT_OTHER)
Admission: EM | Admit: 2021-11-26 | Discharge: 2021-11-26 | Disposition: A | Discharge status: Home / Self Care | Payer: BC Managed Care – PPO | Attending: Emergency Medicine | Admitting: Emergency Medicine

## 2021-11-26 DIAGNOSIS — R072 Precordial pain: Secondary | ICD-10-CM | POA: Diagnosis not present

## 2021-11-26 DIAGNOSIS — Z79899 Other long term (current) drug therapy: Secondary | ICD-10-CM | POA: Insufficient documentation

## 2021-11-26 DIAGNOSIS — R079 Chest pain, unspecified: Secondary | ICD-10-CM | POA: Diagnosis not present

## 2021-11-26 DIAGNOSIS — R944 Abnormal results of kidney function studies: Secondary | ICD-10-CM | POA: Diagnosis not present

## 2021-11-26 DIAGNOSIS — R7309 Other abnormal glucose: Secondary | ICD-10-CM | POA: Diagnosis not present

## 2021-11-26 DIAGNOSIS — R0789 Other chest pain: Secondary | ICD-10-CM | POA: Diagnosis not present

## 2021-11-26 DIAGNOSIS — I10 Essential (primary) hypertension: Secondary | ICD-10-CM | POA: Insufficient documentation

## 2021-11-26 DIAGNOSIS — R030 Elevated blood-pressure reading, without diagnosis of hypertension: Secondary | ICD-10-CM

## 2021-11-26 LAB — CBC
HCT: 43.3 % (ref 36.0–46.0)
Hemoglobin: 14.4 g/dL (ref 12.0–15.0)
MCH: 28.6 pg (ref 26.0–34.0)
MCHC: 33.3 g/dL (ref 30.0–36.0)
MCV: 85.9 fL (ref 80.0–100.0)
Platelets: 253 10*3/uL (ref 150–400)
RBC: 5.04 MIL/uL (ref 3.87–5.11)
RDW: 12.6 % (ref 11.5–15.5)
WBC: 6.6 10*3/uL (ref 4.0–10.5)
nRBC: 0 % (ref 0.0–0.2)

## 2021-11-26 LAB — BASIC METABOLIC PANEL
Anion gap: 11 (ref 5–15)
BUN: 21 mg/dL — ABNORMAL HIGH (ref 6–20)
CO2: 25 mmol/L (ref 22–32)
Calcium: 9.7 mg/dL (ref 8.9–10.3)
Chloride: 103 mmol/L (ref 98–111)
Creatinine, Ser: 1.24 mg/dL — ABNORMAL HIGH (ref 0.44–1.00)
GFR, Estimated: 52 mL/min — ABNORMAL LOW (ref >60)
Glucose, Bld: 107 mg/dL — ABNORMAL HIGH (ref 70–99)
Potassium: 3.5 mmol/L (ref 3.5–5.1)
Sodium: 139 mmol/L (ref 135–145)

## 2021-11-26 LAB — TROPONIN I (HIGH SENSITIVITY)
Troponin I (High Sensitivity): <2 ng/L (ref <18)
Troponin I (High Sensitivity): 2 ng/L (ref <18)

## 2021-11-26 NOTE — ED Notes (Signed)
Pt reports she is feeling better, believes that her blood pressure elevation was related to anxiety. Blood pressure within normal limits at this time.

## 2021-11-26 NOTE — ED Provider Notes (Signed)
[facility] EMERGENCY DEPT Provider Note   CSN: [id number] Arrival date & time: 11/26/21  1632     History  Chief Complaint  Patient presents with   Hypertension   Chest Pain    Lindsey Dunlap is a 56 y.o. female.  She has a history of hypertension and anxiety.  She felt some chest tightness today and checked her blood pressure and found it to be elevated.  She said its been running higher than normal 140s over 80s.  She denies any shortness of breath headache blurry vision double vision numbness weakness.  She has not been on any kind of stimulant medications and denies cocaine.  The history is provided by the patient.  Hypertension This is a chronic problem. The current episode started 6 to 12 hours ago. The problem occurs constantly. Associated symptoms include chest pain. Pertinent negatives include no abdominal pain, no headaches and no shortness of breath. Nothing aggravates the symptoms. Nothing relieves the symptoms. She has tried nothing for the symptoms. The treatment provided no relief.  Chest Pain Pain location:  Substernal area Pain quality: tightness   Pain severity:  Moderate Onset quality:  Gradual Progression:  Resolved Chronicity:  New Context: not trauma   Relieved by:  None tried Worsened by:  Nothing Ineffective treatments:  None tried Associated symptoms: no abdominal pain, no fever, no headache, no nausea, no shortness of breath and no vomiting   Risk factors: hypertension        Home Medications Prior to Admission medications   Medication Sig Start Date End Date Taking? Authorizing Provider  celecoxib (CELEBREX) 100 MG capsule Take 100 mg by mouth 2 (two) times daily. 10/25/20   [provider]  famotidine (PEPCID) 20 MG tablet Take 20 mg by mouth at bedtime.    [provider]  hydrochlorothiazide (HYDRODIURIL) 25 MG tablet Take 1 tablet (25 mg total) by mouth daily. For blood pressure 05/01/21   Doreene Nest, NP   magnesium 30 MG tablet Take 1.25 mg by mouth 2 (two) times daily.    [provider]  sertraline (ZOLOFT) 50 MG tablet TAKE 1 TABLET BY MOUTH ONCE DAILY FOR ANXIETY 06/04/21   Doreene Nest, NP      Allergies    Patient has no known allergies.    Review of Systems   Review of Systems  Constitutional:  Negative for fever.  HENT:  Negative for sore throat.   Eyes:  Negative for visual disturbance.  Respiratory:  Negative for shortness of breath.   Cardiovascular:  Positive for chest pain.  Gastrointestinal:  Negative for abdominal pain, nausea and vomiting.  Genitourinary:  Negative for dysuria.  Skin:  Negative for rash.  Neurological:  Negative for headaches.    Physical Exam Updated Vital Signs BP (!) 119/57   Pulse 79   Temp 97.9 F (36.6 C) (Oral)   Resp 15   Ht 5\' 6"  (1.676 m)   Wt (!) 158.8 kg   SpO2 96%   BMI 56.49 kg/m  Physical Exam Vitals and nursing note reviewed.  Constitutional:      General: She is not in acute distress.    Appearance: She is well-developed.  HENT:     Head: Normocephalic and atraumatic.  Eyes:     Conjunctiva/sclera: Conjunctivae normal.  Cardiovascular:     Rate and Rhythm: Normal rate and regular rhythm.     Heart sounds: Normal heart sounds. No murmur heard. Pulmonary:     Effort:  Pulmonary effort is normal. No respiratory distress.     Breath sounds: Normal breath sounds.  Abdominal:     Palpations: Abdomen is soft.     Tenderness: There is no abdominal tenderness.  Musculoskeletal:        General: No swelling. Normal range of motion.     Cervical back: Neck supple.     Right lower leg: No tenderness. No edema.     Left lower leg: No tenderness. No edema.  Skin:    General: Skin is warm and dry.     Capillary Refill: Capillary refill takes less than 2 seconds.  Neurological:     General: No focal deficit present.     Mental Status: She is alert.     ED Results / Procedures / Treatments   Labs (all labs  ordered are listed, but only abnormal results are displayed) Labs Reviewed  BASIC METABOLIC PANEL - Abnormal; Notable for the following components:      Result Value   Glucose, Bld 107 (*)    BUN 21 (*)    Creatinine, Ser 1.24 (*)    GFR, Estimated 52 (*)    All other components within normal limits  CBC  TROPONIN I (HIGH SENSITIVITY)  TROPONIN I (HIGH SENSITIVITY)    EKG EKG Interpretation  Date/Time:  Thursday November 26 2021 16:38:31 EDT Ventricular Rate:  92 PR Interval:  148 QRS Duration: 96 QT Interval:  360 QTC Calculation: 445 R Axis:   59 Text Interpretation: Normal sinus rhythm with sinus arrhythmia Normal ECG No previous ECGs available Confirmed by Aletta Edouard [phone]) on 11/26/2021 4:51:11 PM  Radiology DG Chest 2 View  Result Date: 11/26/2021 CLINICAL DATA:  Chest pain EXAM: CHEST - 2 VIEW COMPARISON:  None Available. FINDINGS: The heart size and mediastinal contours are within normal limits. Both lungs are clear. The visualized skeletal structures are unremarkable. IMPRESSION: No active cardiopulmonary disease. Electronically Signed   By: Rolm Baptise M.D.   On: 11/26/2021 17:20    Procedures Procedures    Medications Ordered in ED Medications - No data to display  ED Course/ Medical Decision Making/ A&P                           Medical Decision Making Amount and/or Complexity of Data Reviewed Labs: ordered. Radiology: ordered.   This patient complains of chest tightness hypertension; this involves an extensive number of treatment Options and is a complaint that carries with it a high risk of complications and morbidity. The differential includes ACS, hypertension, hypertensive emergency, pneumothorax, PE, anxiety  I ordered, reviewed and interpreted labs, which included CBC normal, chemistries with mild elevation of glucose BUN and creatinine, troponins flat I ordered imaging studies which included chest x-ray and I independently    visualized  and interpreted imaging which showed no acute findings  Additional history obtained from patient's husband Previous records obtained and reviewed in epic no recent admissions Cardiac monitoring reviewed, normal sinus rhythm Social determinants considered, no significant barriers Critical Interventions: None  After the interventions stated above, I reevaluated the patient and found patient's chest tightness is resolved and blood pressure improved Admission and further testing considered, no indications for admission or further work-up at this time.  Recommended close follow-up with PCP.  Return instructions discussed         Final Clinical Impression(s) / ED Diagnoses Final diagnoses:  Chest tightness  Transient elevated blood pressure  Rx / DC Orders ED Discharge Orders     None         Terrilee Files, MD 11/27/21 1058

## 2021-11-26 NOTE — Discharge Instructions (Addendum)
You were seen in the emergency department for some chest tightness and elevated blood pressure.  You had lab work EKG chest x-ray that did not show any significant findings.  Blood pressure normalized while you were here.  Please follow-up with your primary care doctor for further evaluation.  Return to the emergency department if any worsening or concerning symptoms.

## 2021-11-26 NOTE — Telephone Encounter (Signed)
Per access nurse note pt agreed to go to ED. Sending note to Gentry Fitz NP and [facility] pool.

## 2021-11-26 NOTE — Telephone Encounter (Signed)
This could have probably been addressed in our office this afternoon had I known.  Please contact patient and offer her a follow up appointment for tomorrow (11/27/2021) afternoon in one of the blocked slots.  ED labs pending.

## 2021-11-26 NOTE — Telephone Encounter (Signed)
[facility] - Client TELEPHONE ADVICE RECORD Access[facility]urse Patient Name: Lindsey Dunlap Gender: Female DOB: 09-01-1965 Age: 56 Y 3 M 11 D Return Phone Number: [phone] (Primary) Address: [place]  [address] Client [facility] - Client Client Site [facility] - Day Provider Alma Friendly - NP Contact Type Call Who Is Calling Patient / Member / Family / Caregiver Call Type Triage / Clinical Relationship To Patient Self Return Phone Number [phone] (Primary) Chief Complaint Blood Pressure High Reason for Call Symptomatic / Request for Health Information Initial Comment caller states blood pressure is 137/99 Translation No Nurse Assessment Nurse: Marcello Moores, RN, Cheri Date/Time (Eastern Time): 11/26/2021 4:03:15 PM Confirm and document reason for call. If symptomatic, describe symptoms. ---Caller states her BP is 177/99 currently. States she feels stressed from recent life events. Does the patient have any new or worsening symptoms? ---Yes Will a triage be completed? ---Yes Related visit to physician within the last 2 weeks? ---No Does the PT have any chronic conditions? (i.e. diabetes, asthma, this includes High risk factors for pregnancy, etc.) ---Yes List chronic conditions. ---HTN, anxiety Is the patient pregnant or possibly pregnant? (Ask all females between the ages of 92-54) ---No Is this a behavioral health or substance abuse call? ---No Guidelines Guideline Title Affirmed Question Affirmed Notes Nurse Date/Time (Eastern Time) Blood Pressure - High [1] Systolic BP >= 308 OR Diastolic >= 657 AND [8] cardiac (e.g., breathing difficulty, chest pain) or neurologic symptoms (e.g., new-onset blurred or double vision, unsteady gait) Marcello Moores, RN, Cheri 11/26/2021 4:05:05 PM PLEASE NOTE: All timestamps contained within this report are represented as [place]  Standard Time. CONFIDENTIALTY NOTICE: This fax transmission is intended only for the addressee. It contains information that is legally privileged, confidential or otherwise protected from use or disclosure. If you are not the intended recipient, you are strictly prohibited from reviewing, disclosing, copying using or disseminating any of this information or taking any action in reliance on or regarding this information. If you have received this fax in error, please notify us immediately by telephone so that we can arrange for its return to [place]. Phone: [phone], Toll-Free: [phone], Fax: [phone] Page: 2 of 2 Call Id: [id number] [place]. Time Eilene Ghazi Time) Disposition Final User 11/26/2021 4:08:02 PM Go to ED Now Yes Marcello Moores, RN, Cheri Final Disposition 11/26/2021 4:08:02 PM Go to ED Now Yes Marcello Moores, RN, Cheri Caller Disagree/Comply Comply Caller Understands Yes PreDisposition InappropriateToAsk Care Advice Given Per Guideline GO TO ED NOW: * You need to be seen in the Emergency Department. * Go to the ED at ___________ [place] now. Drive carefully. NOTE TO TRIAGER - DRIVING: * Another adult should drive. * Patient should not delay going to the emergency department. * If immediate transportation is not available via car, rideshare (e.g., Lyft, Uber), or taxi, then the patient should be instructed to call [id number]. CALL EMS 911 IF: * Passes out or faints * Becomes confused * Becomes too weak to stand * You become worse CARE ADVICE given per High Blood Pressure (Adult) guideline. Referrals [facility] - E

## 2021-11-26 NOTE — Telephone Encounter (Signed)
Patient called and stated her blood pressure is running 177/90. She also stated she is dealing with some stress lately. Patient was sent to access nurse.

## 2021-11-26 NOTE — Telephone Encounter (Signed)
Please be on the lookout for this access nurse note. Thanks!

## 2021-11-26 NOTE — ED Triage Notes (Signed)
Pt endorses HTN and left sided chest tightness today. Pain also in shoulder blade. Reports recent stress and hx of anxiety.

## 2021-11-27 NOTE — Telephone Encounter (Signed)
Noted  

## 2021-11-27 NOTE — Telephone Encounter (Signed)
Patient declined appointment today. Scheduled follow up for 10/04.

## 2021-12-02 ENCOUNTER — Encounter: Payer: Self-pay | Admitting: Primary Care

## 2021-12-02 ENCOUNTER — Ambulatory Visit: Payer: BC Managed Care – PPO | Admitting: Primary Care

## 2021-12-02 DIAGNOSIS — F411 Generalized anxiety disorder: Secondary | ICD-10-CM | POA: Diagnosis not present

## 2021-12-02 DIAGNOSIS — G4733 Obstructive sleep apnea (adult) (pediatric): Secondary | ICD-10-CM | POA: Diagnosis not present

## 2021-12-02 DIAGNOSIS — I10 Essential (primary) hypertension: Secondary | ICD-10-CM | POA: Diagnosis not present

## 2021-12-02 MED ORDER — SERTRALINE HCL 50 MG PO TABS: 75.0000 mg | ORAL_TABLET | Freq: Every day | ORAL | 0 refills | Status: DC

## 2021-12-02 NOTE — Progress Notes (Signed)
Subjective:    Patient ID: Lindsey Dunlap, female    DOB: [birth year], 56 y.o.   MRN: [id number]  HPI  Lindsey Dunlap is a very pleasant 56 y.o. female with a history of hypertension, GAD, GERD, prediabetes, frequent headaches who presents today for ED follow-up.  She presented to [facility] on 11/26/2021 with elevated blood pressure readings and chest pain.  Earlier that day she developed chest tightness which prompted her to check her blood pressure and found it to be running around the 140s over 80s which is higher than normal.  She spoke with her nurse triage line who recommended ED evaluation.  During her visit she underwent lab work which revealed mild hyperglycemia, elevated serum creatinine and BUN with reduction in GFR.  Troponin levels negative.  Chest x-ray negative.  Symptoms improved while in the ED so she was discharged home later that evening with recommendation for PCP follow-up.  Since her ED visit she is feeling stressed with home and work life. Symptoms include chest tightness, feeling overwhelmed, worrying, difficulty getting things done because there's too much to do. She's felt this way for numerous months.   She has not missed any doses HCTZ 25 mg. She is checking BP at home which is running 140's-160's/80's-90's. She suspects her blood pressure has been elevated due to stress. She does not feel anxious when at work.   She denies an increase in headaches, dizziness.     BP Readings from Last 3 Encounters:  12/02/21 134/82  11/26/21 (!) 119/57  03/24/21 120/76   Wt Readings from Last 3 Encounters:  12/02/21 (!) 353 lb (160.1 kg)  11/26/21 (!) 350 lb (158.8 kg)  03/24/21 (!) 351 lb (159.2 kg)      Review of Systems  Respiratory:  Positive for chest tightness. Negative for shortness of breath.   Cardiovascular:  Negative for chest pain.  Psychiatric/Behavioral:  The patient is nervous/anxious.          Past Medical History:  Diagnosis Date    Anxiety    Chickenpox    GERD (gastroesophageal reflux disease)    High blood pressure    Joint pain    Knee pain    Night sweats    Seasonal allergies    SOB (shortness of breath)    Vitamin D deficiency     Social History   Socioeconomic History   Marital status: Married    Spouse name: Jonny Ruiz   Number of children: 2   Years of education: Not on file   Highest education level: Not on file  Occupational History   Occupation: Presenter, broadcasting  Tobacco Use   Smoking status: Former    Packs/day: 1.00    Years: 22.00    Total pack years: 22.00    Types: Cigarettes    Quit date: 04/25/2008    Years since quitting: 13.6   Smokeless tobacco: Never  Vaping Use   Vaping Use: Never used  Substance and Sexual Activity   Alcohol use: No   Drug use: Never   Sexual activity: Yes  Other Topics Concern   Not on file  Social History Narrative   Married.   2 children.   Works as a Futures trader.   Enjoys reading, working on projects   Social Determinants of Corporate investment banker Strain: Not on file  Food Insecurity: Not on file  Transportation Needs: Not on file  Physical Activity: Not on file  Stress: Not on file  Social Connections: Not on file  Intimate Partner Violence: Not on file    Past Surgical History:  Procedure Laterality Date   ABLATION     COLONOSCOPY WITH PROPOFOL N/A 12/12/2018   Procedure: COLONOSCOPY WITH PROPOFOL;  Surgeon: Irene Shipper, MD;  Location: [facility];  Service: Endoscopy;  Laterality: N/A;   TONSILLECTOMY  1991    Family History  Problem Relation Age of Onset   Hyperlipidemia Mother    Hypertension Mother    Sleep apnea Mother    Arthritis/Rheumatoid Sister    Hypertension Maternal Grandmother    Hypertension Maternal Grandfather    High blood pressure Father    Liver cancer Maternal Uncle    Uterine cancer Neg Hx    Colon polyps Neg Hx    Esophageal cancer Neg Hx    Pancreatic cancer Neg Hx    Stomach cancer Neg Hx     Rectal cancer Neg Hx     No Known Allergies  Current Outpatient Medications on File Prior to Visit  Medication Sig Dispense Refill   famotidine (PEPCID) 20 MG tablet Take 20 mg by mouth at bedtime.     hydrochlorothiazide (HYDRODIURIL) 25 MG tablet Take 1 tablet (25 mg total) by mouth daily. For blood pressure 90 tablet 2   celecoxib (CELEBREX) 100 MG capsule Take 100 mg by mouth 2 (two) times daily. (Patient not taking: Reported on 12/02/2021)     magnesium 30 MG tablet Take 1.25 mg by mouth 2 (two) times daily. (Patient not taking: Reported on 12/02/2021)     No current facility-administered medications on file prior to visit.    BP 134/82   Pulse 80   Temp 99 F (37.2 C) (Temporal)   Ht 5\' 6"  (1.676 m)   Wt (!) 353 lb (160.1 kg)   SpO2 98%   BMI 56.98 kg/m  Objective:   Physical Exam Cardiovascular:     Rate and Rhythm: Normal rate and regular rhythm.  Pulmonary:     Effort: Pulmonary effort is normal.     Breath sounds: Normal breath sounds.  Musculoskeletal:     Cervical back: Neck supple.  Skin:    General: Skin is warm and dry.           Assessment & Plan:   Problem List Items Addressed This Visit       Cardiovascular and Mediastinum   Essential hypertension    Borderline high today, home readings are better. Normal readings in our office historically.  Continue HCTZ 25 mg daily.  Do suspect that symptoms are secondary to anxiety, will work to reduce anxiety.  She will update in a few weeks.   ED notes, labs, imaging reviewed.        Other   GAD (generalized anxiety disorder)    Uncontrolled given stressors at home. Do suspect her symptoms are secondary to anxiety, especially with ED work up and BP reading today.  Increase Zoloft to 75 mg daily. She will update in a few weeks. Consider dose increase to 100 mg if warranted.       Relevant Medications   sertraline (ZOLOFT) 50 MG tablet       Pleas Koch, NP

## 2021-12-02 NOTE — Assessment & Plan Note (Signed)
Uncontrolled given stressors at home. Do suspect her symptoms are secondary to anxiety, especially with ED work up and BP reading today.  Increase Zoloft to 75 mg daily. She will update in a few weeks. Consider dose increase to 100 mg if warranted.

## 2021-12-02 NOTE — Patient Instructions (Signed)
We increased your sertraline (Zoloft) to 75 mg. Take 1 and 1/2 tablet daily.  Please update me in 2-3 weeks as discussed.  It was a pleasure to see you today!

## 2021-12-02 NOTE — Assessment & Plan Note (Addendum)
Borderline high today, home readings are better. Normal readings in our office historically.  Continue HCTZ 25 mg daily.  Do suspect that symptoms are secondary to anxiety, will work to reduce anxiety.  She will update in a few weeks.   ED notes, labs, imaging reviewed.

## 2021-12-03 ENCOUNTER — Encounter: Payer: Self-pay | Admitting: Primary Care

## 2022-01-19 ENCOUNTER — Other Ambulatory Visit: Payer: Self-pay | Admitting: Primary Care

## 2022-01-19 DIAGNOSIS — I10 Essential (primary) hypertension: Secondary | ICD-10-CM

## 2022-01-20 NOTE — Telephone Encounter (Signed)
LVM for patient to call and schedule

## 2022-01-20 NOTE — Telephone Encounter (Signed)
Patient is due for CPE/follow up in late January 2024. Please schedule.   

## 2022-02-08 DIAGNOSIS — F411 Generalized anxiety disorder: Secondary | ICD-10-CM

## 2022-02-08 MED ORDER — SERTRALINE HCL 50 MG PO TABS: 75.0000 mg | ORAL_TABLET | Freq: Every day | ORAL | 0 refills | Status: DC

## 2022-03-26 ENCOUNTER — Encounter: Payer: Self-pay | Admitting: Primary Care

## 2022-03-26 ENCOUNTER — Other Ambulatory Visit: Payer: Self-pay | Admitting: Primary Care

## 2022-03-26 ENCOUNTER — Ambulatory Visit (INDEPENDENT_AMBULATORY_CARE_PROVIDER_SITE_OTHER): Payer: BC Managed Care – PPO | Admitting: Primary Care

## 2022-03-26 VITALS — BP 128/84 | HR 70 | Temp 97.3°F | Ht 66.0 in | Wt 356.0 lb

## 2022-03-26 DIAGNOSIS — F411 Generalized anxiety disorder: Secondary | ICD-10-CM | POA: Diagnosis not present

## 2022-03-26 DIAGNOSIS — I10 Essential (primary) hypertension: Secondary | ICD-10-CM | POA: Diagnosis not present

## 2022-03-26 DIAGNOSIS — Z23 Encounter for immunization: Secondary | ICD-10-CM

## 2022-03-26 DIAGNOSIS — E559 Vitamin D deficiency, unspecified: Secondary | ICD-10-CM

## 2022-03-26 DIAGNOSIS — G8929 Other chronic pain: Secondary | ICD-10-CM

## 2022-03-26 DIAGNOSIS — Z0000 Encounter for general adult medical examination without abnormal findings: Secondary | ICD-10-CM | POA: Diagnosis not present

## 2022-03-26 DIAGNOSIS — R7303 Prediabetes: Secondary | ICD-10-CM

## 2022-03-26 DIAGNOSIS — M2550 Pain in unspecified joint: Secondary | ICD-10-CM

## 2022-03-26 DIAGNOSIS — K219 Gastro-esophageal reflux disease without esophagitis: Secondary | ICD-10-CM

## 2022-03-26 DIAGNOSIS — R519 Headache, unspecified: Secondary | ICD-10-CM | POA: Diagnosis not present

## 2022-03-26 DIAGNOSIS — E1165 Type 2 diabetes mellitus with hyperglycemia: Secondary | ICD-10-CM

## 2022-03-26 LAB — COMPREHENSIVE METABOLIC PANEL
ALT: 26 U/L (ref 0–35)
AST: 19 U/L (ref 0–37)
Albumin: 4.2 g/dL (ref 3.5–5.2)
Alkaline Phosphatase: 101 U/L (ref 39–117)
BUN: 14 mg/dL (ref 6–23)
CO2: 26 mEq/L (ref 19–32)
Calcium: 9.7 mg/dL (ref 8.4–10.5)
Chloride: 105 mEq/L (ref 96–112)
Creatinine, Ser: 0.82 mg/dL (ref 0.40–1.20)
GFR: 81.27 mL/min (ref >60.00)
Glucose, Bld: 122 mg/dL — ABNORMAL HIGH (ref 70–99)
Potassium: 3.9 mEq/L (ref 3.5–5.1)
Sodium: 140 mEq/L (ref 135–145)
Total Bilirubin: 0.7 mg/dL (ref 0.2–1.2)
Total Protein: 6.9 g/dL (ref 6.0–8.3)

## 2022-03-26 LAB — CBC
HCT: 40.8 % (ref 36.0–46.0)
Hemoglobin: 14.2 g/dL (ref 12.0–15.0)
MCHC: 34.8 g/dL (ref 30.0–36.0)
MCV: 84.4 fl (ref 78.0–100.0)
Platelets: 242 10*3/uL (ref 150.0–400.0)
RBC: 4.84 Mil/uL (ref 3.87–5.11)
RDW: 13.3 % (ref 11.5–15.5)
WBC: 4.8 10*3/uL (ref 4.0–10.5)

## 2022-03-26 LAB — LIPID PANEL
Cholesterol: 172 mg/dL (ref 0–200)
HDL: 54.4 mg/dL (ref >39.00)
LDL Cholesterol: 92 mg/dL (ref 0–99)
NonHDL: 117.75
Total CHOL/HDL Ratio: 3
Triglycerides: 127 mg/dL (ref 0.0–149.0)
VLDL: 25.4 mg/dL (ref 0.0–40.0)

## 2022-03-26 LAB — VITAMIN D 25 HYDROXY (VIT D DEFICIENCY, FRACTURES): VITD: 19.63 ng/mL — ABNORMAL LOW (ref 30.00–100.00)

## 2022-03-26 LAB — TSH: TSH: 0.14 u[IU]/mL — ABNORMAL LOW (ref 0.35–5.50)

## 2022-03-26 LAB — T4, FREE: Free T4: 0.79 ng/dL (ref 0.60–1.60)

## 2022-03-26 LAB — VITAMIN B12: Vitamin B-12: 285 pg/mL (ref 211–911)

## 2022-03-26 LAB — HEMOGLOBIN A1C: Hgb A1c MFr Bld: 6.6 % — ABNORMAL HIGH (ref 4.6–6.5)

## 2022-03-26 MED ORDER — METFORMIN HCL ER 500 MG PO TB24: 500.0000 mg | ORAL_TABLET | Freq: Every day | ORAL | 1 refills | Status: DC

## 2022-03-26 MED ORDER — VITAMIN D (ERGOCALCIFEROL) 1.25 MG (50000 UNIT) PO CAPS: ORAL_CAPSULE | ORAL | 0 refills | Status: DC

## 2022-03-26 NOTE — Assessment & Plan Note (Signed)
Improved.  Continue to monitor.  Continue BP control.

## 2022-03-26 NOTE — Progress Notes (Signed)
Subjective:    Patient ID: Lindsey Dunlap, female    DOB: 02-28-1966, 57 y.o.   MRN: [id number]  HPI  Lindsey Dunlap is a very pleasant 57 y.o. female who presents today for complete physical and follow up of chronic conditions.  Immunizations: -Tetanus: Completed in 2013 -Influenza: has not completed this season -Shingles: Completed Shingrix series  Diet: Fair diet.  Exercise: No regular exercise.  Eye exam: Completes annually  Dental exam: Completes semi-annually   Pap Smear: Completed per GYN Mammogram: Completed at GYN office  Colonoscopy: Completed in 2020, due 2030  BP Readings from Last 3 Encounters:  03/26/22 128/84  12/02/21 134/82  11/26/21 (!) 119/57        Review of Systems  Constitutional:  Negative for unexpected weight change.  HENT:  Negative for rhinorrhea.   Respiratory:  Negative for cough and shortness of breath.   Cardiovascular:  Negative for chest pain.  Gastrointestinal:  Negative for constipation and diarrhea.  Genitourinary:  Negative for difficulty urinating.  Musculoskeletal:  Negative for arthralgias and myalgias.  Skin:  Negative for rash.       Hair thinning  Allergic/Immunologic: Negative for environmental allergies.  Neurological:  Negative for dizziness and headaches.  Psychiatric/Behavioral:  The patient is not nervous/anxious.          Past Medical History:  Diagnosis Date   Anxiety    Chickenpox    GERD (gastroesophageal reflux disease)    High blood pressure    Joint pain    Knee pain    Night sweats    Seasonal allergies    SOB (shortness of breath)    Vitamin D deficiency     Social History   Socioeconomic History   Marital status: Married    Spouse name: Lindsey Dunlap   Number of children: 2   Years of education: Not on file   Highest education level: Not on file  Occupational History   Occupation: Presenter, broadcasting  Tobacco Use   Smoking status: Former    Packs/day: 1.00    Years: 22.00    Total  pack years: 22.00    Types: Cigarettes    Quit date: 04/25/2008    Years since quitting: 13.9   Smokeless tobacco: Never  Vaping Use   Vaping Use: Never used  Substance and Sexual Activity   Alcohol use: No   Drug use: Never   Sexual activity: Yes  Other Topics Concern   Not on file  Social History Narrative   Married.   2 children.   Works as a Futures trader.   Enjoys reading, working on projects   Social Determinants of Corporate investment banker Strain: Not on file  Food Insecurity: Not on file  Transportation Needs: Not on file  Physical Activity: Not on file  Stress: Not on file  Social Connections: Not on file  Intimate Partner Violence: Not on file    Past Surgical History:  Procedure Laterality Date   ABLATION     COLONOSCOPY WITH PROPOFOL N/A 12/12/2018   Procedure: COLONOSCOPY WITH PROPOFOL;  Surgeon: Hilarie Fredrickson, MD;  Location: [facility];  Service: Endoscopy;  Laterality: N/A;   TONSILLECTOMY  1991    Family History  Problem Relation Age of Onset   Hyperlipidemia Mother    Hypertension Mother    Sleep apnea Mother    Arthritis/Rheumatoid Sister    Hypertension Maternal Grandmother    Hypertension Maternal Grandfather    High blood pressure Father  Liver cancer Maternal Uncle    Uterine cancer Neg Hx    Colon polyps Neg Hx    Esophageal cancer Neg Hx    Pancreatic cancer Neg Hx    Stomach cancer Neg Hx    Rectal cancer Neg Hx     No Known Allergies  Current Outpatient Medications on File Prior to Visit  Medication Sig Dispense Refill   famotidine (PEPCID) 20 MG tablet Take 20 mg by mouth at bedtime.     hydrochlorothiazide (HYDRODIURIL) 25 MG tablet Take 1 tablet by mouth once daily for blood pressure 90 tablet 0   sertraline (ZOLOFT) 50 MG tablet Take 1.5 tablets (75 mg total) by mouth daily. For anxiety 135 tablet 0   No current facility-administered medications on file prior to visit.    BP 128/84   Pulse 70   Temp (!) 97.3 F (36.3  C) (Temporal)   Ht 5\' 6"  (1.676 m)   Wt (!) 356 lb (161.5 kg)   SpO2 97%   BMI 57.46 kg/m  Objective:   Physical Exam HENT:     Right Ear: Tympanic membrane and ear canal normal.     Left Ear: Tympanic membrane and ear canal normal.     Nose: Nose normal.  Eyes:     Conjunctiva/sclera: Conjunctivae normal.     Pupils: Pupils are equal, round, and reactive to light.  Neck:     Thyroid: No thyromegaly.  Cardiovascular:     Rate and Rhythm: Normal rate and regular rhythm.     Heart sounds: No murmur heard. Pulmonary:     Effort: Pulmonary effort is normal.     Breath sounds: Normal breath sounds. No rales.  Abdominal:     General: Bowel sounds are normal.     Palpations: Abdomen is soft.     Tenderness: There is no abdominal tenderness.  Musculoskeletal:        General: Normal range of motion.     Cervical back: Neck supple.  Lymphadenopathy:     Cervical: No cervical adenopathy.  Skin:    General: Skin is warm and dry.     Findings: No rash.  Neurological:     Mental Status: She is alert and oriented to person, place, and time.     Cranial Nerves: No cranial nerve deficit.     Deep Tendon Reflexes: Reflexes are normal and symmetric.  Psychiatric:        Mood and Affect: Mood normal.           Assessment & Plan:  Preventative health care Assessment & Plan: Tetanus due, provided today. Declines influenza vaccine. Colonoscopy UTD, due 2030. Mammogram and pap smear UTD, follows with GYN.  Discussed the importance of a healthy diet and regular exercise in order for weight loss, and to reduce the risk of further co-morbidity.  Exam stable. Labs pending.  Follow up in 1 year for repeat physical.    Essential hypertension Assessment & Plan: Controlled today.  Continue HCTZ 25 mg daily. CMP pending.  Orders: -     Lipid panel -     Comprehensive metabolic panel -     TSH -     CBC -     T4, free  Frequent headaches Assessment &  Plan: Improved.  Continue to monitor.  Continue BP control.    GAD (generalized anxiety disorder) Assessment & Plan: Controlled overall.  Continue Zoloft 50 mg daily. Continue to monitor.    Prediabetes Assessment & Plan: Repeat  A1C pending  Discussed the importance of a healthy diet and regular exercise in order for weight loss, and to reduce the risk of further co-morbidity.   Orders: -     Hemoglobin A1c  Chronic pain of multiple joints Assessment & Plan: Controlled No concerns today.   Vitamin D deficiency -     VITAMIN D 25 Hydroxy (Vit-D Deficiency, Fractures) -     Vitamin B12  Gastroesophageal reflux disease, unspecified whether esophagitis present Assessment & Plan: Controlled.  Continue famotidine 20 mg daily.         Pleas Koch, NP

## 2022-03-26 NOTE — Assessment & Plan Note (Signed)
Controlled today.  Continue HCTZ 25 mg daily. CMP pending.

## 2022-03-26 NOTE — Assessment & Plan Note (Signed)
Repeat A1C pending.  Discussed the importance of a healthy diet and regular exercise in order for weight loss, and to reduce the risk of further co-morbidity.  

## 2022-03-26 NOTE — Assessment & Plan Note (Signed)
Controlled.  Continue famotidine 20 mg daily.  

## 2022-03-26 NOTE — Assessment & Plan Note (Signed)
Controlled. No concerns today. 

## 2022-03-26 NOTE — Assessment & Plan Note (Signed)
Tetanus due, provided today. Declines influenza vaccine. Colonoscopy UTD, due 2030. Mammogram and pap smear UTD, follows with GYN.  Discussed the importance of a healthy diet and regular exercise in order for weight loss, and to reduce the risk of further co-morbidity.  Exam stable. Labs pending.  Follow up in 1 year for repeat physical.

## 2022-03-26 NOTE — Addendum Note (Signed)
Addended by: Pat Kocher on: 03/26/2022 10:28 AM   Modules accepted: Orders

## 2022-03-26 NOTE — Assessment & Plan Note (Signed)
Controlled overall.  Continue Zoloft 50 mg daily. Continue to monitor.

## 2022-03-29 ENCOUNTER — Encounter: Payer: Self-pay | Admitting: Internal Medicine

## 2022-03-30 DIAGNOSIS — R7989 Other specified abnormal findings of blood chemistry: Secondary | ICD-10-CM

## 2022-03-31 ENCOUNTER — Other Ambulatory Visit: Payer: Self-pay | Admitting: Primary Care

## 2022-03-31 DIAGNOSIS — R7989 Other specified abnormal findings of blood chemistry: Secondary | ICD-10-CM

## 2022-04-09 ENCOUNTER — Other Ambulatory Visit (INDEPENDENT_AMBULATORY_CARE_PROVIDER_SITE_OTHER): Payer: BC Managed Care – PPO

## 2022-04-09 DIAGNOSIS — R7989 Other specified abnormal findings of blood chemistry: Secondary | ICD-10-CM

## 2022-04-09 LAB — TSH: TSH: 0.15 u[IU]/mL — ABNORMAL LOW (ref 0.35–5.50)

## 2022-04-09 LAB — T3, FREE: T3, Free: 3.8 pg/mL (ref 2.3–4.2)

## 2022-04-09 LAB — T4, FREE: Free T4: 0.9 ng/dL (ref 0.60–1.60)

## 2022-04-13 LAB — THYROID STIMULATING IMMUNOGLOBULIN: TSI: <89 % baseline (ref <140)

## 2022-04-20 ENCOUNTER — Encounter: Payer: Self-pay | Admitting: *Deleted

## 2022-04-21 ENCOUNTER — Other Ambulatory Visit: Payer: Self-pay | Admitting: Primary Care

## 2022-04-21 ENCOUNTER — Encounter: Payer: Self-pay | Admitting: *Deleted

## 2022-04-21 DIAGNOSIS — R7989 Other specified abnormal findings of blood chemistry: Secondary | ICD-10-CM

## 2022-04-21 DIAGNOSIS — I10 Essential (primary) hypertension: Secondary | ICD-10-CM

## 2022-04-21 DIAGNOSIS — F411 Generalized anxiety disorder: Secondary | ICD-10-CM

## 2022-04-21 NOTE — Telephone Encounter (Signed)
Insurance has been updated

## 2022-05-05 ENCOUNTER — Encounter (HOSPITAL_COMMUNITY): Payer: BC Managed Care – PPO

## 2022-05-06 ENCOUNTER — Encounter (HOSPITAL_COMMUNITY): Payer: BC Managed Care – PPO

## 2022-06-25 ENCOUNTER — Encounter: Payer: Self-pay | Admitting: Primary Care

## 2022-06-25 ENCOUNTER — Ambulatory Visit: Payer: BC Managed Care – PPO | Admitting: Primary Care

## 2022-06-25 VITALS — BP 134/82 | HR 72 | Temp 98.2°F | Ht 66.0 in | Wt 356.0 lb

## 2022-06-25 DIAGNOSIS — E1165 Type 2 diabetes mellitus with hyperglycemia: Secondary | ICD-10-CM

## 2022-06-25 DIAGNOSIS — Z7985 Long-term (current) use of injectable non-insulin antidiabetic drugs: Secondary | ICD-10-CM | POA: Diagnosis not present

## 2022-06-25 DIAGNOSIS — Z7984 Long term (current) use of oral hypoglycemic drugs: Secondary | ICD-10-CM | POA: Diagnosis not present

## 2022-06-25 LAB — MICROALBUMIN / CREATININE URINE RATIO
Creatinine,U: 178.7 mg/dL
Microalb Creat Ratio: 0.4 mg/g (ref 0.0–30.0)
Microalb, Ur: 0.7 mg/dL (ref 0.0–1.9)

## 2022-06-25 LAB — POCT GLYCOSYLATED HEMOGLOBIN (HGB A1C): Hemoglobin A1C: 5.9 % — AB (ref 4.0–5.6)

## 2022-06-25 MED ORDER — TIRZEPATIDE 2.5 MG/0.5ML ~~LOC~~ SOAJ: 2.5000 mg | SUBCUTANEOUS | 0 refills | Status: DC

## 2022-06-25 NOTE — Patient Instructions (Signed)
Continue metformin.  Start tirzepitide Lindsey Dunlap) for diabetes/weight loss. Start by injecting 2.5 mg into the skin once weekly for 4 weeks, then increase to 5 mg once weekly thereafter. Please notify me once you've used your last 2.5 mg pen so that I can prescribe the next dose.   Please schedule a follow up visit for 3 months.  It was a pleasure to see you today!

## 2022-06-25 NOTE — Progress Notes (Signed)
Subjective:    Patient ID: Lindsey Dunlap, female    DOB: 12-Jul-1965, 57 y.o.   MRN: [id number]  HPI  Lindsey Dunlap is a very pleasant 57 y.o. female with a history of hypertension, new onset type 2 diabetes, chronic joint pain who presents today for follow up of diabetes.  Current medications include: metformin XR 500 mg daily. She is interested in trying [facility] for weight loss and diabetes.   She is checking her blood glucose 0 times daily.  Last A1C: 6.6 in January 2024,  Last Eye Exam: UTD Last Foot Exam: Due Pneumonia Vaccination: Never completed Urine Microalbumin: Due Statin: None  Dietary changes since last visit: Her appetite has been reduced. She is working on cutting back on carbs and increasing protein.   Exercise: None  BP Readings from Last 3 Encounters:  06/25/22 134/82  03/26/22 128/84  12/02/21 134/82    Wt Readings from Last 3 Encounters:  06/25/22 (!) 356 lb (161.5 kg)  03/26/22 (!) 356 lb (161.5 kg)  12/02/21 (!) 353 lb (160.1 kg)      Review of Systems  Respiratory:  Negative for shortness of breath.   Cardiovascular:  Negative for chest pain.  Musculoskeletal:  Positive for arthralgias.  Neurological:  Positive for numbness.         Past Medical History:  Diagnosis Date   Anxiety    Chickenpox    GERD (gastroesophageal reflux disease)    High blood pressure    Joint pain    Knee pain    Night sweats    Seasonal allergies    SOB (shortness of breath)    Vitamin D deficiency     Social History   Socioeconomic History   Marital status: Married    Spouse name: Jonny Ruiz   Number of children: 2   Years of education: Not on file   Highest education level: Not on file  Occupational History   Occupation: Presenter, broadcasting  Tobacco Use   Smoking status: Former    Packs/day: 1.00    Years: 22.00    Additional pack years: 0.00    Total pack years: 22.00    Types: Cigarettes    Quit date: 04/25/2008    Years since quitting:  14.1   Smokeless tobacco: Never  Vaping Use   Vaping Use: Never used  Substance and Sexual Activity   Alcohol use: No   Drug use: Never   Sexual activity: Yes  Other Topics Concern   Not on file  Social History Narrative   Married.   2 children.   Works as a Futures trader.   Enjoys reading, working on projects   Social Determinants of Corporate investment banker Strain: Not on file  Food Insecurity: Not on file  Transportation Needs: Not on file  Physical Activity: Not on file  Stress: Not on file  Social Connections: Not on file  Intimate Partner Violence: Not on file    Past Surgical History:  Procedure Laterality Date   ABLATION     COLONOSCOPY WITH PROPOFOL N/A 12/12/2018   Procedure: COLONOSCOPY WITH PROPOFOL;  Surgeon: Hilarie Fredrickson, MD;  Location: [facility];  Service: Endoscopy;  Laterality: N/A;   TONSILLECTOMY  1991    Family History  Problem Relation Age of Onset   Hyperlipidemia Mother    Hypertension Mother    Sleep apnea Mother    Arthritis/Rheumatoid Sister    Hypertension Maternal Grandmother    Hypertension Maternal Grandfather  High blood pressure Father    Liver cancer Maternal Uncle    Uterine cancer Neg Hx    Colon polyps Neg Hx    Esophageal cancer Neg Hx    Pancreatic cancer Neg Hx    Stomach cancer Neg Hx    Rectal cancer Neg Hx     No Known Allergies  Current Outpatient Medications on File Prior to Visit  Medication Sig Dispense Refill   famotidine (PEPCID) 20 MG tablet Take 20 mg by mouth at bedtime.     hydrochlorothiazide (HYDRODIURIL) 25 MG tablet Take 1 tablet by mouth once daily for blood pressure 90 tablet 2   metFORMIN (GLUCOPHAGE-XR) 500 MG 24 hr tablet Take 1 tablet (500 mg total) by mouth daily with breakfast. for diabetes. 90 tablet 1   sertraline (ZOLOFT) 50 MG tablet TAKE 1 & 1/2 (ONE & ONE-HALF) TABLETS BY MOUTH ONCE DAILY FOR ANXIETY 135 tablet 2   Vitamin D, Ergocalciferol, (DRISDOL) 1.25 MG (50000 UNIT) CAPS  capsule Take 1 capsule by mouth once weekly for 12 weeks. (Patient not taking: Reported on 06/25/2022) 12 capsule 0   No current facility-administered medications on file prior to visit.    BP 134/82   Pulse 72   Temp 98.2 F (36.8 C) (Temporal)   Ht 5\' 6"  (1.676 m)   Wt (!) 356 lb (161.5 kg)   SpO2 96%   BMI 57.46 kg/m  Objective:   Physical Exam Cardiovascular:     Rate and Rhythm: Normal rate and regular rhythm.  Pulmonary:     Effort: Pulmonary effort is normal.     Breath sounds: Normal breath sounds.  Musculoskeletal:     Cervical back: Neck supple.  Skin:    General: Skin is warm and dry.           Assessment & Plan:  Type 2 diabetes mellitus with hyperglycemia, without long-term current use of insulin [facility]) Assessment & Plan: New diagnosis as of January 2024. Improved and controlled with A1C of 5.9!  Continue metformin XR 500 mg daily. Start tirzepitide [name]) for diabetes/weight loss. Start by injecting 2.5 mg into the skin once weekly for 4 weeks, then increase to 5 mg once weekly thereafter. She will update once she uses her last 2.5 mg pen.  Plan to titrate up from there.  Continue to work on diet. Try to exercise.   Follow up in 3 months.    Orders: -     POCT glycosylated hemoglobin (Hb A1C) -     Tirzepatide; Inject 2.5 mg into the skin once a week. for diabetes.  Dispense: 2 mL; Refill: 0 -     Microalbumin / creatinine urine ratio        Doreene Nest, NP

## 2022-06-25 NOTE — Assessment & Plan Note (Addendum)
New diagnosis as of January 2024. Improved and controlled with A1C of 5.9!  Continue metformin XR 500 mg daily. Start tirzepitide [name]) for diabetes/weight loss. Start by injecting 2.5 mg into the skin once weekly for 4 weeks, then increase to 5 mg once weekly thereafter. She will update once she uses her last 2.5 mg pen.  Plan to titrate up from there.  Continue to work on diet. Try to exercise.   Follow up in 3 months.

## 2022-06-28 DIAGNOSIS — E1165 Type 2 diabetes mellitus with hyperglycemia: Secondary | ICD-10-CM

## 2022-06-29 MED ORDER — TIRZEPATIDE 2.5 MG/0.5ML ~~LOC~~ SOAJ: 2.5000 mg | SUBCUTANEOUS | 0 refills | Status: DC

## 2022-07-02 ENCOUNTER — Other Ambulatory Visit (HOSPITAL_COMMUNITY): Payer: Self-pay

## 2022-07-02 NOTE — Telephone Encounter (Signed)
Per test claim to insurance PA is not needed and refill is too soon due to last fill on 06/29/2022

## 2022-07-23 MED ORDER — TIRZEPATIDE 5 MG/0.5ML ~~LOC~~ SOAJ: 5.0000 mg | SUBCUTANEOUS | 0 refills | Status: DC

## 2022-08-18 ENCOUNTER — Other Ambulatory Visit: Payer: Self-pay | Admitting: Primary Care

## 2022-08-18 DIAGNOSIS — E1165 Type 2 diabetes mellitus with hyperglycemia: Secondary | ICD-10-CM

## 2022-08-18 MED ORDER — TIRZEPATIDE 5 MG/0.5ML ~~LOC~~ SOAJ: 5.0000 mg | SUBCUTANEOUS | 0 refills | Status: DC

## 2022-08-18 NOTE — Telephone Encounter (Signed)
From: Lindsey Dunlap To: Office of Doreene Nest, NP Sent: 08/18/2022 1:49 PM EDT Subject: Medication Renewal Request  Refills have been requested for the following medications:   tirzepatide (MOUNJARO) 5 MG/0.5ML Pen [[name]]  Patient Comment: I'm taking my last injection today if you could please [facility] [place]. Thank you!  [facility]erred pharmacy: [id number] [place], [place] - [address] Delivery method: Daryll Drown

## 2022-09-12 ENCOUNTER — Other Ambulatory Visit: Payer: Self-pay | Admitting: Primary Care

## 2022-09-12 DIAGNOSIS — E1165 Type 2 diabetes mellitus with hyperglycemia: Secondary | ICD-10-CM

## 2022-09-13 ENCOUNTER — Other Ambulatory Visit: Payer: Self-pay | Admitting: Primary Care

## 2022-09-13 DIAGNOSIS — E1165 Type 2 diabetes mellitus with hyperglycemia: Secondary | ICD-10-CM

## 2022-09-24 ENCOUNTER — Ambulatory Visit: Payer: BC Managed Care – PPO | Admitting: Primary Care

## 2022-09-24 ENCOUNTER — Encounter: Payer: Self-pay | Admitting: Primary Care

## 2022-09-24 VITALS — BP 124/76 | HR 75 | Temp 98.2°F | Ht 66.0 in | Wt 335.0 lb

## 2022-09-24 DIAGNOSIS — E1165 Type 2 diabetes mellitus with hyperglycemia: Secondary | ICD-10-CM

## 2022-09-24 DIAGNOSIS — Z7985 Long-term (current) use of injectable non-insulin antidiabetic drugs: Secondary | ICD-10-CM | POA: Diagnosis not present

## 2022-09-24 LAB — POCT GLYCOSYLATED HEMOGLOBIN (HGB A1C): Hemoglobin A1C: 5.3 % (ref 4.0–5.6)

## 2022-09-24 NOTE — Progress Notes (Signed)
Subjective:    Patient ID: Lindsey Dunlap, female    DOB: Feb 28, 1966, 57 y.o.   MRN: [id number]  HPI  Lindsey Dunlap is a very pleasant 57 y.o. female with a history of diabetes, hypertension, frequent headaches, chronic joint pain who presents today for follow-up of diabetes.  Current medications include: Mounjaro 5 mg weekly, metformin XR 5 mg daily.  She is tolerating Mounjaro better now. Metformin has caused her nausea and GI upset.   She is checking her blood glucose 0 times daily.  Last A1C: 5.9 in April 2024, Last Eye Exam: Up-to-date Last Foot Exam: Up-to-date Pneumonia Vaccination: Never completed, declines  Urine Microalbumin: Up-to-date Statin: None, declines   Dietary changes since last visit: Reduced portion sizes, reduced intake of greasy food. She is feeling better overall.    Exercise: None.   BP Readings from Last 3 Encounters:  09/24/22 124/76  06/25/22 134/82  03/26/22 128/84   Wt Readings from Last 3 Encounters:  09/24/22 (!) 335 lb (152 kg)  06/25/22 (!) 356 lb (161.5 kg)  03/26/22 (!) 356 lb (161.5 kg)      Review of Systems  Cardiovascular:  Negative for chest pain.  Gastrointestinal:  Negative for constipation and nausea.  Neurological:  Negative for dizziness and numbness.         Past Medical History:  Diagnosis Date   Anxiety    Chickenpox    GERD (gastroesophageal reflux disease)    High blood pressure    Joint pain    Knee pain    Night sweats    Seasonal allergies    SOB (shortness of breath)    Vitamin D deficiency     Social History   Socioeconomic History   Marital status: Married    Spouse name: Jonny Ruiz   Number of children: 2   Years of education: Not on file   Highest education level: Associate degree: occupational, Scientist, product/process development, or vocational program  Occupational History   Occupation: Presenter, broadcasting  Tobacco Use   Smoking status: Former    Current packs/day: 0.00    Average packs/day: 1 pack/day for  22.0 years (22.0 ttl pk-yrs)    Types: Cigarettes    Start date: 04/25/1986    Quit date: 04/25/2008    Years since quitting: 14.4   Smokeless tobacco: Never  Vaping Use   Vaping status: Never Used  Substance and Sexual Activity   Alcohol use: No   Drug use: Never   Sexual activity: Yes  Other Topics Concern   Not on file  Social History Narrative   Married.   2 children.   Works as a Futures trader.   Enjoys reading, working on projects   Social Determinants of Health   Financial Resource Strain: Low Risk  (09/23/2022)   Overall Financial Resource Strain (CARDIA)    Difficulty of Paying Living Expenses: Not hard at all  Food Insecurity: No Food Insecurity (09/23/2022)   Hunger Vital Sign    Worried About Running Out of Food in the Last Year: Never true    Ran Out of Food in the Last Year: Never true  Transportation Needs: No Transportation Needs (09/23/2022)   PRAPARE - Administrator, Civil Service (Medical): No    Lack of Transportation (Non-Medical): No  Physical Activity: Unknown (09/23/2022)   Exercise Vital Sign    Days of Exercise per Week: 0 days    Minutes of Exercise per Session: Not on file  Stress: Stress Concern Present (  09/23/2022)   [employer] [employer]ational [employer] - Occupational Stress Questionnaire    Feeling of Stress : To some extent  Social Connections: Socially Integrated (09/23/2022)   Social Connection and Isolation Panel [NHANES]    Frequency of Communication with Friends and Family: Twice a week    Frequency of Social Gatherings with Friends and Family: Twice a week    Attends Religious Services: More than 4 times per year    Active Member of Clubs or Organizations: Yes    Attends Banker Meetings: More than 4 times per year    Marital Status: Married  Catering manager Violence: Not on file    Past Surgical History:  Procedure Laterality Date   ABLATION     COLONOSCOPY WITH PROPOFOL N/A 12/12/2018   Procedure:  COLONOSCOPY WITH PROPOFOL;  Surgeon: Hilarie Fredrickson, MD;  Location: [facility];  Service: Endoscopy;  Laterality: N/A;   TONSILLECTOMY  1991    Family History  Problem Relation Age of Onset   Hyperlipidemia Mother    Hypertension Mother    Sleep apnea Mother    Arthritis/Rheumatoid Sister    Hypertension Maternal Grandmother    Hypertension Maternal Grandfather    High blood pressure Father    Liver cancer Maternal Uncle    Uterine cancer Neg Hx    Colon polyps Neg Hx    Esophageal cancer Neg Hx    Pancreatic cancer Neg Hx    Stomach cancer Neg Hx    Rectal cancer Neg Hx     No Known Allergies  Current Outpatient Medications on File Prior to Visit  Medication Sig Dispense Refill   famotidine (PEPCID) 20 MG tablet Take 20 mg by mouth at bedtime.     hydrochlorothiazide (HYDRODIURIL) 25 MG tablet Take 1 tablet by mouth once daily for blood pressure 90 tablet 2   sertraline (ZOLOFT) 50 MG tablet TAKE 1 & 1/2 (ONE & ONE-HALF) TABLETS BY MOUTH ONCE DAILY FOR ANXIETY 135 tablet 2   tirzepatide (MOUNJARO) 5 MG/0.5ML Pen INJECT 5 MG INTO THE SKIN ONCE A WEEK. FOR DIABETES. 2 mL 0   Vitamin D, Ergocalciferol, (DRISDOL) 1.25 MG (50000 UNIT) CAPS capsule Take 1 capsule by mouth once weekly for 12 weeks. (Patient not taking: Reported on 06/25/2022) 12 capsule 0   No current facility-administered medications on file prior to visit.    BP 124/76   Pulse 75   Temp 98.2 F (36.8 C) (Temporal)   Ht 5\' 6"  (1.676 m)   Wt (!) 335 lb (152 kg)   SpO2 95%   BMI 54.07 kg/m  Objective:   Physical Exam Cardiovascular:     Rate and Rhythm: Normal rate and regular rhythm.  Pulmonary:     Effort: Pulmonary effort is normal.     Breath sounds: Normal breath sounds.  Musculoskeletal:     Cervical back: Neck supple.  Skin:    General: Skin is warm and dry.           Assessment & Plan:  Type 2 diabetes mellitus with hyperglycemia, without long-term current use of insulin  (HCC) Assessment & Plan: Improved and well controlled with A1C of 5.3!  [name] has helped with weight loss.   Discontinue metformin XR 500 mg. Increase Mounjaro to 7.5 mg weekly for continued weight loss benefits. She will notify once she uses her last 5 mg dose.  She declines pneumonia vaccine and statin therapy at this time.  Follow up in 6 months.  Orders: -  POCT glycosylated hemoglobin (Hb A1C)        Doreene Nest, NP

## 2022-09-24 NOTE — Patient Instructions (Addendum)
Please notify me once you use your last 5 mg dose of Mounjaro so that I can send in the 7.5 mg dose.  Keep working on [employer].  Increase physical activity.  Please schedule a physical to meet with me in 6 months.   It was a pleasure to see you today!

## 2022-09-24 NOTE — Assessment & Plan Note (Addendum)
Improved and well controlled with A1C of 5.3!  [name] has helped with weight loss.   Discontinue metformin XR 500 mg. Increase Mounjaro to 7.5 mg weekly for continued weight loss benefits. She will notify once she uses her last 5 mg dose.  She declines pneumonia vaccine and statin therapy at this time.  Follow up in 6 months.

## 2022-10-05 ENCOUNTER — Other Ambulatory Visit: Payer: Self-pay | Admitting: Primary Care

## 2022-10-05 DIAGNOSIS — E1165 Type 2 diabetes mellitus with hyperglycemia: Secondary | ICD-10-CM

## 2022-11-16 DIAGNOSIS — Z6841 Body Mass Index (BMI) 40.0 and over, adult: Secondary | ICD-10-CM | POA: Diagnosis not present

## 2022-11-16 DIAGNOSIS — Z01419 Encounter for gynecological examination (general) (routine) without abnormal findings: Secondary | ICD-10-CM | POA: Diagnosis not present

## 2022-11-16 DIAGNOSIS — Z1231 Encounter for screening mammogram for malignant neoplasm of breast: Secondary | ICD-10-CM | POA: Diagnosis not present

## 2022-11-16 DIAGNOSIS — Z124 Encounter for screening for malignant neoplasm of cervix: Secondary | ICD-10-CM | POA: Diagnosis not present

## 2022-11-16 LAB — HM PAP SMEAR

## 2022-11-16 LAB — HM MAMMOGRAPHY

## 2022-12-24 DIAGNOSIS — M25561 Pain in right knee: Secondary | ICD-10-CM | POA: Diagnosis not present

## 2022-12-24 DIAGNOSIS — M25562 Pain in left knee: Secondary | ICD-10-CM | POA: Diagnosis not present

## 2023-01-05 ENCOUNTER — Other Ambulatory Visit: Payer: Self-pay | Admitting: Primary Care

## 2023-01-05 DIAGNOSIS — E1165 Type 2 diabetes mellitus with hyperglycemia: Secondary | ICD-10-CM

## 2023-01-05 NOTE — Telephone Encounter (Signed)
Spoke to pt, scheduled cpe for 04/06/23

## 2023-01-05 NOTE — Telephone Encounter (Signed)
Patient is due for CPE/follow up in early February 2025, this will be required prior to any further refills.  Please schedule, thank you!

## 2023-01-06 ENCOUNTER — Other Ambulatory Visit: Payer: Self-pay | Admitting: Primary Care

## 2023-01-06 DIAGNOSIS — I10 Essential (primary) hypertension: Secondary | ICD-10-CM

## 2023-01-06 DIAGNOSIS — F411 Generalized anxiety disorder: Secondary | ICD-10-CM

## 2023-03-27 ENCOUNTER — Other Ambulatory Visit: Payer: Self-pay | Admitting: Primary Care

## 2023-03-27 DIAGNOSIS — E1165 Type 2 diabetes mellitus with hyperglycemia: Secondary | ICD-10-CM

## 2023-03-28 ENCOUNTER — Encounter: Payer: Self-pay | Admitting: Primary Care

## 2023-04-04 MED ORDER — MOUNJARO 7.5 MG/0.5ML ~~LOC~~ SOAJ
7.5000 mg | SUBCUTANEOUS | 0 refills | Status: DC
Start: 2023-04-04 — End: 2023-04-27

## 2023-04-06 ENCOUNTER — Encounter: Payer: BC Managed Care – PPO | Admitting: Primary Care

## 2023-04-20 ENCOUNTER — Other Ambulatory Visit: Payer: Self-pay | Admitting: Primary Care

## 2023-04-20 DIAGNOSIS — E1165 Type 2 diabetes mellitus with hyperglycemia: Secondary | ICD-10-CM

## 2023-04-23 DIAGNOSIS — G4733 Obstructive sleep apnea (adult) (pediatric): Secondary | ICD-10-CM | POA: Diagnosis not present

## 2023-04-27 ENCOUNTER — Ambulatory Visit (INDEPENDENT_AMBULATORY_CARE_PROVIDER_SITE_OTHER): Payer: BC Managed Care – PPO | Admitting: Primary Care

## 2023-04-27 ENCOUNTER — Encounter: Payer: Self-pay | Admitting: Primary Care

## 2023-04-27 ENCOUNTER — Other Ambulatory Visit: Payer: Self-pay | Admitting: Primary Care

## 2023-04-27 VITALS — BP 128/82 | HR 71 | Temp 97.2°F | Ht 66.0 in | Wt 331.0 lb

## 2023-04-27 DIAGNOSIS — K219 Gastro-esophageal reflux disease without esophagitis: Secondary | ICD-10-CM | POA: Diagnosis not present

## 2023-04-27 DIAGNOSIS — R519 Headache, unspecified: Secondary | ICD-10-CM

## 2023-04-27 DIAGNOSIS — E1165 Type 2 diabetes mellitus with hyperglycemia: Secondary | ICD-10-CM

## 2023-04-27 DIAGNOSIS — Z23 Encounter for immunization: Secondary | ICD-10-CM

## 2023-04-27 DIAGNOSIS — I10 Essential (primary) hypertension: Secondary | ICD-10-CM

## 2023-04-27 DIAGNOSIS — Z0000 Encounter for general adult medical examination without abnormal findings: Secondary | ICD-10-CM

## 2023-04-27 DIAGNOSIS — F411 Generalized anxiety disorder: Secondary | ICD-10-CM

## 2023-04-27 DIAGNOSIS — E559 Vitamin D deficiency, unspecified: Secondary | ICD-10-CM

## 2023-04-27 DIAGNOSIS — Z7985 Long-term (current) use of injectable non-insulin antidiabetic drugs: Secondary | ICD-10-CM

## 2023-04-27 LAB — COMPREHENSIVE METABOLIC PANEL
ALT: 21 U/L (ref 0–35)
AST: 15 U/L (ref 0–37)
Albumin: 4.3 g/dL (ref 3.5–5.2)
Alkaline Phosphatase: 117 U/L (ref 39–117)
BUN: 20 mg/dL (ref 6–23)
CO2: 31 meq/L (ref 19–32)
Calcium: 9.6 mg/dL (ref 8.4–10.5)
Chloride: 101 meq/L (ref 96–112)
Creatinine, Ser: 0.91 mg/dL (ref 0.40–1.20)
GFR: 71.18 mL/min (ref >60.00)
Glucose, Bld: 102 mg/dL — ABNORMAL HIGH (ref 70–99)
Potassium: 4.1 meq/L (ref 3.5–5.1)
Sodium: 140 meq/L (ref 135–145)
Total Bilirubin: 0.7 mg/dL (ref 0.2–1.2)
Total Protein: 7.2 g/dL (ref 6.0–8.3)

## 2023-04-27 LAB — LIPID PANEL
Cholesterol: 182 mg/dL (ref 0–200)
HDL: 59.6 mg/dL (ref >39.00)
LDL Cholesterol: 101 mg/dL — ABNORMAL HIGH (ref 0–99)
NonHDL: 122.35
Total CHOL/HDL Ratio: 3
Triglycerides: 105 mg/dL (ref 0.0–149.0)
VLDL: 21 mg/dL (ref 0.0–40.0)

## 2023-04-27 LAB — HEMOGLOBIN A1C: Hgb A1c MFr Bld: 5.5 % (ref 4.6–6.5)

## 2023-04-27 LAB — VITAMIN D 25 HYDROXY (VIT D DEFICIENCY, FRACTURES): VITD: 14.73 ng/mL — ABNORMAL LOW (ref 30.00–100.00)

## 2023-04-27 MED ORDER — TIRZEPATIDE 10 MG/0.5ML ~~LOC~~ SOAJ
10.0000 mg | SUBCUTANEOUS | 0 refills | Status: DC
Start: 2023-04-27 — End: 2023-07-21

## 2023-04-27 NOTE — Patient Instructions (Signed)
 Stop by the lab prior to leaving today. I will notify you of your results once received.   We increased the dose of your Mounjaro to 10 mg weekly.  Please schedule a follow up visit for 6 months for a diabetes check.  It was a pleasure to see you today!

## 2023-04-27 NOTE — Assessment & Plan Note (Signed)
 Improved and controlled.  Continue sertraline 75 mg daily.

## 2023-04-27 NOTE — Assessment & Plan Note (Signed)
 Repeat A1C pending.  Increase Mounjaro to 10 mg weekly for weight loss purposes.   Follow up in 6 months.

## 2023-04-27 NOTE — Assessment & Plan Note (Signed)
 Controlled.  Continue famotidine 20 mg HS.

## 2023-04-27 NOTE — Assessment & Plan Note (Signed)
 Denies concerns today. Continue to monitor.

## 2023-04-27 NOTE — Assessment & Plan Note (Signed)
 Prevnar 20 and influenza vaccines provided today Pap smear UTD. Follows with GYN Mammogram UTD, completes at GYN office Colonoscopy UTD, due 2030  Discussed the importance of a healthy diet and regular exercise in order for weight loss, and to reduce the risk of further co-morbidity.  Exam stable. Labs pending.  Follow up in 1 year for repeat physical.

## 2023-04-27 NOTE — Addendum Note (Signed)
 Addended by: Lonia Blood on: 04/27/2023 08:49 AM   Modules accepted: Orders

## 2023-04-27 NOTE — Assessment & Plan Note (Signed)
Controlled. ? ?Continue hydrochlorothiazide 25 mg daily. ?CMP pending. ?

## 2023-04-27 NOTE — Progress Notes (Signed)
 Subjective:    Patient ID: Lindsey Dunlap, female    DOB: 03-Mar-1965, 58 y.o.   MRN: [id number]  HPI  Lindsey Dunlap is a very pleasant 58 y.o. female who presents today for complete physical and follow up of chronic conditions.  Immunizations: -Tetanus: Completed in 2024 -Influenza: Influenza vaccine provided today.   -Shingles: Completed Shingrix series -Pneumonia: Never completed.   Diet: Fair diet.  Exercise: No regular exercise.   Eye exam: Completes annually  Dental exam: Completes semi-annually    Pap Smear: Completed per GYN, UTD Mammogram: Completed at GYN office   Colonoscopy: Completed in 2020, due 2030  BP Readings from Last 3 Encounters:  04/27/23 128/82  09/24/22 124/76  06/25/22 134/82     Wt Readings from Last 3 Encounters:  04/27/23 (!) 331 lb (150.1 kg)  09/24/22 (!) 335 lb (152 kg)  06/25/22 (!) 356 lb (161.5 kg)     Review of Systems  Constitutional:  Negative for unexpected weight change.  HENT:  Negative for rhinorrhea.   Respiratory:  Negative for cough and shortness of breath.   Cardiovascular:  Negative for chest pain.  Gastrointestinal:  Positive for constipation. Negative for diarrhea.  Genitourinary:  Negative for difficulty urinating and menstrual problem.  Musculoskeletal:  Negative for arthralgias and myalgias.  Skin:  Negative for rash.  Allergic/Immunologic: Negative for environmental allergies.  Neurological:  Negative for dizziness, numbness and headaches.  Psychiatric/Behavioral:  The patient is not nervous/anxious.          Past Medical History:  Diagnosis Date   Anxiety    Chickenpox    Dyspnea 02/20/2020   GERD (gastroesophageal reflux disease)    High blood pressure    Joint pain    Knee pain    Night sweats    Seasonal allergies    SOB (shortness of breath)    Vitamin D deficiency     Social History   Socioeconomic History   Marital status: Married    Spouse name: Jonny Ruiz   Number of children: 2    Years of education: Not on file   Highest education level: Associate degree: occupational, Scientist, product/process development, or vocational program  Occupational History   Occupation: Presenter, broadcasting  Tobacco Use   Smoking status: Former    Current packs/day: 0.00    Average packs/day: 1 pack/day for 22.0 years (22.0 ttl pk-yrs)    Types: Cigarettes    Start date: 04/25/1986    Quit date: 04/25/2008    Years since quitting: 15.0   Smokeless tobacco: Never  Vaping Use   Vaping status: Never Used  Substance and Sexual Activity   Alcohol use: No   Drug use: Never   Sexual activity: Yes  Other Topics Concern   Not on file  Social History Narrative   Married.   2 children.   Works as a Futures trader.   Enjoys reading, working on projects   Social Drivers of Corporate investment banker Strain: Low Risk  (04/26/2023)   Overall Financial Resource Strain (CARDIA)    Difficulty of Paying Living Expenses: Not hard at all  Food Insecurity: No Food Insecurity (04/26/2023)   Hunger Vital Sign    Worried About Running Out of Food in the Last Year: Never true    Ran Out of Food in the Last Year: Never true  Transportation Needs: No Transportation Needs (04/26/2023)   PRAPARE - Administrator, Civil Service (Medical): No    Lack of Transportation (Non-Medical):  No  Physical Activity: Insufficiently Active (04/26/2023)   Exercise Vital Sign    Days of Exercise per Week: 2 days    Minutes of Exercise per Session: 20 min  Stress: Stress Concern Present (04/26/2023)   [employer] of Occupational Health - Occupational Stress Questionnaire    Feeling of Stress : To some extent  Social Connections: Socially Integrated (04/26/2023)   Social Connection and Isolation Panel [NHANES]    Frequency of Communication with Friends and Family: Twice a week    Frequency of Social Gatherings with Friends and Family: Once a week    Attends Religious Services: More than 4 times per year    Active Member of Clubs or  Organizations: Yes    Attends Banker Meetings: More than 4 times per year    Marital Status: Married  Catering manager Violence: Not on file    Past Surgical History:  Procedure Laterality Date   ABLATION     COLONOSCOPY WITH PROPOFOL N/A 12/12/2018   Procedure: COLONOSCOPY WITH PROPOFOL;  Surgeon: Hilarie Fredrickson, MD;  Location: [facility];  Service: Endoscopy;  Laterality: N/A;   TONSILLECTOMY  1991    Family History  Problem Relation Age of Onset   Hyperlipidemia Mother    Hypertension Mother    Sleep apnea Mother    Arthritis/Rheumatoid Sister    Hypertension Maternal Grandmother    Hypertension Maternal Grandfather    High blood pressure Father    Liver cancer Maternal Uncle    Uterine cancer Neg Hx    Colon polyps Neg Hx    Esophageal cancer Neg Hx    Pancreatic cancer Neg Hx    Stomach cancer Neg Hx    Rectal cancer Neg Hx     No Known Allergies  Current Outpatient Medications on File Prior to Visit  Medication Sig Dispense Refill   famotidine (PEPCID) 20 MG tablet Take 20 mg by mouth at bedtime.     hydrochlorothiazide (HYDRODIURIL) 25 MG tablet Take 1 tablet by mouth once daily for blood pressure 90 tablet 0   sertraline (ZOLOFT) 50 MG tablet TAKE 1 & 1/2 (ONE & ONE-HALF) TABLETS BY MOUTH ONCE DAILY FOR ANXIETY 135 tablet 0   No current facility-administered medications on file prior to visit.    BP 128/82   Pulse 71   Temp (!) 97.2 F (36.2 C) (Temporal)   Ht 5\' 6"  (1.676 m)   Wt (!) 331 lb (150.1 kg)   SpO2 97%   BMI 53.42 kg/m  Objective:   Physical Exam HENT:     Right Ear: Tympanic membrane and ear canal normal.     Left Ear: Tympanic membrane and ear canal normal.  Eyes:     Pupils: Pupils are equal, round, and reactive to light.  Cardiovascular:     Rate and Rhythm: Normal rate and regular rhythm.  Pulmonary:     Effort: Pulmonary effort is normal.     Breath sounds: Normal breath sounds.  Abdominal:     General: Bowel  sounds are normal.     Palpations: Abdomen is soft.     Tenderness: There is no abdominal tenderness.  Musculoskeletal:        General: Normal range of motion.     Cervical back: Neck supple.  Skin:    General: Skin is warm and dry.  Neurological:     Mental Status: She is alert and oriented to person, place, and time.     Cranial Nerves: No  cranial nerve deficit.     Deep Tendon Reflexes:     Reflex Scores:      Patellar reflexes are 2+ on the right side and 2+ on the left side. Psychiatric:        Mood and Affect: Mood normal.           Assessment & Plan:  Preventative health care Assessment & Plan: Prevnar 20 and influenza vaccines provided today Pap smear UTD. Follows with GYN Mammogram UTD, completes at GYN office Colonoscopy UTD, due 2030  Discussed the importance of a healthy diet and regular exercise in order for weight loss, and to reduce the risk of further co-morbidity.  Exam stable. Labs pending.  Follow up in 1 year for repeat physical.    Essential hypertension Assessment & Plan: Controlled.  Continue hydrochlorothiazide 25 mg daily. CMP pending.  Orders: -     Lipid panel -     Comprehensive metabolic panel  Gastroesophageal reflux disease, unspecified whether esophagitis present Assessment & Plan: Controlled.  Continue famotidine 20 mg HS.   Type 2 diabetes mellitus with hyperglycemia, without long-term current use of insulin (HCC) Assessment & Plan: Repeat A1C pending.  Increase Mounjaro to 10 mg weekly for weight loss purposes.   Follow up in 6 months.  Orders: -     Tirzepatide; Inject 10 mg into the skin once a week. for diabetes.  Dispense: 6 mL; Refill: 0 -     Lipid panel -     Hemoglobin A1c  Frequent headaches Assessment & Plan: Denies concerns today.  Continue to monitor.    GAD (generalized anxiety disorder) Assessment & Plan: Improved and controlled.  Continue sertraline 75 mg daily.   Vitamin D  deficiency -     VITAMIN D 25 Hydroxy (Vit-D Deficiency, Fractures)        Doreene Nest, NP

## 2023-04-28 DIAGNOSIS — E559 Vitamin D deficiency, unspecified: Secondary | ICD-10-CM

## 2023-04-28 DIAGNOSIS — M25561 Pain in right knee: Secondary | ICD-10-CM | POA: Diagnosis not present

## 2023-04-28 DIAGNOSIS — M25562 Pain in left knee: Secondary | ICD-10-CM | POA: Diagnosis not present

## 2023-04-28 MED ORDER — VITAMIN D (ERGOCALCIFEROL) 1.25 MG (50000 UNIT) PO CAPS: ORAL_CAPSULE | ORAL | 0 refills | Status: DC

## 2023-05-21 DIAGNOSIS — G4733 Obstructive sleep apnea (adult) (pediatric): Secondary | ICD-10-CM | POA: Diagnosis not present

## 2023-05-31 ENCOUNTER — Encounter: Payer: Self-pay | Admitting: Primary Care

## 2023-06-21 DIAGNOSIS — G4733 Obstructive sleep apnea (adult) (pediatric): Secondary | ICD-10-CM | POA: Diagnosis not present

## 2023-07-20 ENCOUNTER — Other Ambulatory Visit (INDEPENDENT_AMBULATORY_CARE_PROVIDER_SITE_OTHER)

## 2023-07-20 ENCOUNTER — Ambulatory Visit: Payer: Self-pay | Admitting: Primary Care

## 2023-07-20 DIAGNOSIS — E559 Vitamin D deficiency, unspecified: Secondary | ICD-10-CM

## 2023-07-20 LAB — VITAMIN D 25 HYDROXY (VIT D DEFICIENCY, FRACTURES): VITD: 30.58 ng/mL (ref 30.00–100.00)

## 2023-07-21 ENCOUNTER — Other Ambulatory Visit: Payer: Self-pay | Admitting: Primary Care

## 2023-07-21 DIAGNOSIS — E559 Vitamin D deficiency, unspecified: Secondary | ICD-10-CM

## 2023-07-21 DIAGNOSIS — E1165 Type 2 diabetes mellitus with hyperglycemia: Secondary | ICD-10-CM

## 2023-07-29 DIAGNOSIS — M25561 Pain in right knee: Secondary | ICD-10-CM | POA: Diagnosis not present

## 2023-07-29 DIAGNOSIS — M25562 Pain in left knee: Secondary | ICD-10-CM | POA: Diagnosis not present

## 2023-10-19 ENCOUNTER — Other Ambulatory Visit: Payer: Self-pay | Admitting: Primary Care

## 2023-10-19 ENCOUNTER — Telehealth: Payer: Self-pay | Admitting: Primary Care

## 2023-10-19 DIAGNOSIS — E1165 Type 2 diabetes mellitus with hyperglycemia: Secondary | ICD-10-CM

## 2023-10-19 NOTE — Telephone Encounter (Signed)
 Contacted pt to r/s appt for tomorrow. Pt requested a rx refill on Monjouro. Pt's appt r/s to 9/10.   Prescription Request  10/19/2023  LOV: 04/27/2023  What is the name of the medication or equipment? tirzepatide  (MOUNJARO ) 10 MG/0.5ML Pen  Have you contacted your pharmacy to request a refill? No   Which pharmacy would you like this sent to?    [id number] [place], [place] - [address] [place] [address] Phone: [phone] Fax: [phone]    Patient notified that their request is being sent to the clinical staff for review and that they should receive a response within 2 business days.   Please advise at Mobile [phone] (mobile)

## 2023-10-20 ENCOUNTER — Ambulatory Visit: Admitting: Primary Care

## 2023-10-20 NOTE — Telephone Encounter (Signed)
 Please call patient:  Please let her know that I sent refills for Monjaro yesterday. Have her check with her pharmacy please.

## 2023-10-20 NOTE — Telephone Encounter (Signed)
 Called patient and reviewed all information. Patient verbalized understanding. Will call if any further questions.

## 2023-10-26 ENCOUNTER — Ambulatory Visit: Admitting: Primary Care

## 2023-11-02 ENCOUNTER — Ambulatory Visit: Admitting: Primary Care

## 2023-11-09 ENCOUNTER — Encounter: Payer: Self-pay | Admitting: Primary Care

## 2023-11-09 ENCOUNTER — Ambulatory Visit: Admitting: Primary Care

## 2023-11-09 ENCOUNTER — Ambulatory Visit: Payer: Self-pay | Admitting: Primary Care

## 2023-11-09 VITALS — BP 124/82 | HR 87 | Temp 97.5°F | Ht 66.0 in | Wt 306.0 lb

## 2023-11-09 DIAGNOSIS — E1165 Type 2 diabetes mellitus with hyperglycemia: Secondary | ICD-10-CM | POA: Diagnosis not present

## 2023-11-09 LAB — POCT GLYCOSYLATED HEMOGLOBIN (HGB A1C): Hemoglobin A1C: 5.2 % (ref 4.0–5.6)

## 2023-11-09 NOTE — Patient Instructions (Signed)
 Continue Mounjaro  10 mg weekly for diabetes.  Please schedule a physical to meet with me in 6 months.   It was a pleasure to see you today!

## 2023-11-09 NOTE — Assessment & Plan Note (Addendum)
 Controlled.  Offered glucometer equipment, but kindly declined.  Continued taking Mounjaro  10 mg/week.   Foot exam performed. Urine Microalbumin next visit.  Follow up in 6 months.  I evaluated patient, was consulted regarding treatment, and agree with assessment and plan per Stephie Xu, MSN, FNP student.   Mallie Gaskins, NP-C

## 2023-11-09 NOTE — Progress Notes (Signed)
 Subjective:    Patient ID: Lindsey Dunlap, female    DOB: 1965-07-20, 57 y.o.   MRN: [id number]  Lindsey Dunlap is a very pleasant 58 y.o. female with a history of hypertension, type 2 diabetes, frequent headaches, GAD who presents today for follow-up of diabetes.  Current medications include: Mounjaro  10 mg weekly   She is checking her blood glucose 0 times daily.    Last A1C: 5.5 in February 2025, 5.2 today Last Eye Exam: UTD Last Foot Exam: Due  Pneumonia Vaccination: 2025 Urine Microalbumin: UTD Statin: None  Dietary changes since last visit: Increased grilled meat and veggies. Sometimes peanut butter sandwiches.    Exercise: None.   BP Readings from Last 3 Encounters:  11/09/23 124/82  04/27/23 128/82  09/24/22 124/76   Wt Readings from Last 3 Encounters:  11/09/23 (!) 306 lb (138.8 kg)  04/27/23 (!) 331 lb (150.1 kg)  09/24/22 (!) 335 lb (152 kg)      Review of Systems  Eyes:  Negative for visual disturbance.  Cardiovascular:  Negative for chest pain.  Gastrointestinal:  Negative for constipation, diarrhea and nausea.  Neurological:  Negative for numbness.         Past Medical History:  Diagnosis Date   Anxiety    Chickenpox    Dyspnea 02/20/2020   GERD (gastroesophageal reflux disease)    High blood pressure    Joint pain    Knee pain    Night sweats    Seasonal allergies    SOB (shortness of breath)    Vitamin D  deficiency     Social History   Socioeconomic History   Marital status: Married    Spouse name: john   Number of children: 2   Years of education: Not on file   Highest education level: Associate degree: occupational, Scientist, product/process development, or vocational program  Occupational History   Occupation: Presenter, broadcasting  Tobacco Use   Smoking status: Former    Current packs/day: 0.00    Average packs/day: 1 pack/day for 22.0 years (22.0 ttl pk-yrs)    Types: Cigarettes    Start date: 04/25/1986    Quit date: 04/25/2008    Years since  quitting: 15.5   Smokeless tobacco: Never  Vaping Use   Vaping status: Never Used  Substance and Sexual Activity   Alcohol use: No   Drug use: Never   Sexual activity: Yes  Other Topics Concern   Not on file  Social History Narrative   Married.   2 children.   Works as a Futures trader.   Enjoys reading, working on projects   Social Drivers of Corporate investment banker Strain: Low Risk  (04/26/2023)   Overall Financial Resource Strain (CARDIA)    Difficulty of Paying Living Expenses: Not hard at all  Food Insecurity: No Food Insecurity (04/26/2023)   Hunger Vital Sign    Worried About Running Out of Food in the Last Year: Never true    Ran Out of Food in the Last Year: Never true  Transportation Needs: No Transportation Needs (04/26/2023)   PRAPARE - Administrator, Civil Service (Medical): No    Lack of Transportation (Non-Medical): No  Physical Activity: Insufficiently Active (04/26/2023)   Exercise Vital Sign    Days of Exercise per Week: 2 days    Minutes of Exercise per Session: 20 min  Stress: Stress Concern Present (04/26/2023)   [employer] of Occupational Health - Occupational Stress Questionnaire    Feeling  of Stress : To some extent  Social Connections: Socially Integrated (04/26/2023)   Social Connection and Isolation Panel    Frequency of Communication with Friends and Family: Twice a week    Frequency of Social Gatherings with Friends and Family: Once a week    Attends Religious Services: More than 4 times per year    Active Member of Clubs or Organizations: Yes    Attends Banker Meetings: More than 4 times per year    Marital Status: Married  Catering manager Violence: Not on file    Past Surgical History:  Procedure Laterality Date   ABLATION     COLONOSCOPY WITH PROPOFOL  N/A 12/12/2018   Procedure: COLONOSCOPY WITH PROPOFOL ;  Surgeon: Abran Norleen SAILOR, MD;  Location: [facility];  Service: Endoscopy;  Laterality: N/A;    TONSILLECTOMY  1991    Family History  Problem Relation Age of Onset   Hyperlipidemia Mother    Hypertension Mother    Sleep apnea Mother    Arthritis/Rheumatoid Sister    Hypertension Maternal Grandmother    Hypertension Maternal Grandfather    High blood pressure Father    Liver cancer Maternal Uncle    Uterine cancer Neg Hx    Colon polyps Neg Hx    Esophageal cancer Neg Hx    Pancreatic cancer Neg Hx    Stomach cancer Neg Hx    Rectal cancer Neg Hx     No Known Allergies  Current Outpatient Medications on File Prior to Visit  Medication Sig Dispense Refill   famotidine (PEPCID) 20 MG tablet Take 20 mg by mouth at bedtime.     hydrochlorothiazide  (HYDRODIURIL ) 25 MG tablet Take 1 tablet by mouth once daily for blood pressure 90 tablet 3   sertraline  (ZOLOFT ) 50 MG tablet TAKE 1 & 1/2 (ONE & ONE-HALF) TABLETS BY MOUTH ONCE DAILY FOR ANXIETY 135 tablet 3   tirzepatide  (MOUNJARO ) 10 MG/0.5ML Pen INJECT 10 MG INTO THE SKIN ONCE A WEEK. FOR DIABETES. 6 mL 0   Vitamin D , Ergocalciferol , (DRISDOL ) 1.25 MG (50000 UNIT) CAPS capsule Take 1 capsule by mouth once a week (Patient not taking: Reported on 11/09/2023) 12 capsule 0   No current facility-administered medications on file prior to visit.    BP 124/82   Pulse 87   Temp (!) 97.5 F (36.4 C) (Temporal)   Ht 5' 6 (1.676 m)   Wt (!) 306 lb (138.8 kg)   SpO2 97%   BMI 49.39 kg/m  Objective:   Physical Exam Cardiovascular:     Rate and Rhythm: Normal rate and regular rhythm.  Pulmonary:     Effort: Pulmonary effort is normal.     Breath sounds: Normal breath sounds.  Musculoskeletal:     Cervical back: Neck supple.  Skin:    General: Skin is warm and dry.  Neurological:     Mental Status: She is alert and oriented to person, place, and time.  Psychiatric:        Mood and Affect: Mood normal.     Physical Exam        Assessment & Plan:  Type 2 diabetes mellitus with hyperglycemia, without long-term current  use of insulin  (HCC) Assessment & Plan: Controlled.  Offered glucometer equipment, but kindly declined.  Continued taking Mounjaro  10 mg/week.   Foot exam performed. Urine Microalbumin next visit.  Follow up in 6 months.  I evaluated patient, was consulted regarding treatment, and agree with assessment and plan per Kristin Rudd,  MSN, FNP student.   Mallie Gaskins, NP-C   Orders: -     POCT glycosylated hemoglobin (Hb A1C)    Assessment and Plan Assessment & Plan      Comer MARLA Gaskins, NP  History of Present Illness

## 2023-11-09 NOTE — Progress Notes (Signed)
   Established Patient Office Visit  Subjective   Patient ID: Lindsey Dunlap, female    DOB: June 19, 1965  Age: 58 y.o. MRN: [id number]  Chief Complaint  Patient presents with   Medical Management of Chronic Issues    HPI Lindsey Dunlap is a 58 year old female with a history of type 2 diabetes, hypertension coming in for follow-up.  Current medications include: Mounjaro  10 mg/week 51 lbs lost in a year  Not checking glucose at home, does not have equipment.   Last A1C: 5.5 in February 2025. 5.2 today. Last Eye Exam: November 2024 Last Foot Exam: 11/09/2023 Pneumonia Vaccination: 2025 Urine Microalbumin: Due Statin: Not on statin  Dietary changes since last visit: Grilled meat and vegetable, if in a hurry a peanut butter sandwich.   Exercise: None   Review of Systems  Constitutional: Negative.   HENT: Negative.    Eyes: Negative.   Cardiovascular: Negative.   Gastrointestinal: Negative.   Skin: Negative.   Neurological: Negative.   Endo/Heme/Allergies: Negative.        Objective:     There were no vitals taken for this visit.   Physical Exam HENT:     Head: Normocephalic.  Eyes:     Extraocular Movements: Extraocular movements intact.     Pupils: Pupils are equal, round, and reactive to light.  Cardiovascular:     Rate and Rhythm: Normal rate and regular rhythm.     Pulses: Normal pulses.  Abdominal:     General: Bowel sounds are normal.     Palpations: Abdomen is soft.  Skin:    General: Skin is warm and dry.     Capillary Refill: Capillary refill takes less than 2 seconds.  Neurological:     Mental Status: She is alert.      No results found for any visits on 11/09/23.    The 10-year ASCVD risk score (Arnett DK, et al., 2019) is: 4.3%    Assessment & Plan:   Problem List Items Addressed This Visit       Endocrine   Type 2 diabetes mellitus with hyperglycemia (HCC) - Primary   Relevant Orders   POCT glycosylated hemoglobin (Hb A1C)     No follow-ups on file.    Maryanne Huneycutt, RN

## 2023-11-22 DIAGNOSIS — H25043 Posterior subcapsular polar age-related cataract, bilateral: Secondary | ICD-10-CM | POA: Diagnosis not present

## 2023-11-22 DIAGNOSIS — H524 Presbyopia: Secondary | ICD-10-CM | POA: Diagnosis not present

## 2023-11-22 DIAGNOSIS — E119 Type 2 diabetes mellitus without complications: Secondary | ICD-10-CM | POA: Diagnosis not present

## 2023-11-22 LAB — HM DIABETES EYE EXAM

## 2024-01-16 ENCOUNTER — Other Ambulatory Visit: Payer: Self-pay | Admitting: Primary Care

## 2024-01-16 DIAGNOSIS — E1165 Type 2 diabetes mellitus with hyperglycemia: Secondary | ICD-10-CM

## 2024-01-19 ENCOUNTER — Other Ambulatory Visit (HOSPITAL_COMMUNITY): Payer: Self-pay

## 2024-01-19 ENCOUNTER — Telehealth: Payer: Self-pay | Admitting: Pharmacy Technician

## 2024-01-19 NOTE — Telephone Encounter (Addendum)
 Pharmacy Patient Advocate Encounter   Received notification from Onbase that prior authorization for Mounjaro  10MG /0.5ML auto-injectors  is required/requested.   Insurance verification completed.   The patient is insured through [facility].   Per test claim: PA required; PA submitted to above mentioned insurance via Latent Key/confirmation #/EOC [id number] Status is pending

## 2024-01-20 ENCOUNTER — Other Ambulatory Visit (HOSPITAL_COMMUNITY): Payer: Self-pay

## 2024-01-20 NOTE — Telephone Encounter (Signed)
 Pharmacy Patient Advocate Encounter  Received notification from [employer] that Prior Authorization for Mounjaro  10MG /0.5ML auto-injectors  has been APPROVED from 01/19/24 to 01/18/25. Ran test claim, Copay is $25.00. This test claim was processed through [facility]- copay amounts may vary at other pharmacies due to pharmacy/plan contracts, or as the patient moves through the different stages of their insurance plan.   PA #/Case ID/Reference #: [id number]

## 2024-02-14 DIAGNOSIS — F411 Generalized anxiety disorder: Secondary | ICD-10-CM

## 2024-03-20 ENCOUNTER — Ambulatory Visit: Payer: Self-pay | Admitting: Psychology

## 2024-05-08 ENCOUNTER — Ambulatory Visit: Admitting: Primary Care
